# Patient Record
Sex: Female | Born: 1945 | Race: White | Hispanic: No | Marital: Married | Smoking: Never smoker
Health system: Southern US, Community
[De-identification: ages and names within clinical notes are randomized; demographics above are authoritative.]

## PROBLEM LIST (undated history)

## (undated) DIAGNOSIS — E871 Hypo-osmolality and hyponatremia: Secondary | ICD-10-CM

## (undated) DIAGNOSIS — Z9289 Personal history of other medical treatment: Secondary | ICD-10-CM

## (undated) DIAGNOSIS — K219 Gastro-esophageal reflux disease without esophagitis: Secondary | ICD-10-CM

## (undated) DIAGNOSIS — Z8679 Personal history of other diseases of the circulatory system: Secondary | ICD-10-CM

## (undated) DIAGNOSIS — M1990 Unspecified osteoarthritis, unspecified site: Secondary | ICD-10-CM

## (undated) DIAGNOSIS — F410 Panic disorder [episodic paroxysmal anxiety] without agoraphobia: Secondary | ICD-10-CM

## (undated) DIAGNOSIS — I350 Nonrheumatic aortic (valve) stenosis: Secondary | ICD-10-CM

## (undated) DIAGNOSIS — K5790 Diverticulosis of intestine, part unspecified, without perforation or abscess without bleeding: Secondary | ICD-10-CM

## (undated) DIAGNOSIS — R011 Cardiac murmur, unspecified: Secondary | ICD-10-CM

## (undated) DIAGNOSIS — I10 Essential (primary) hypertension: Secondary | ICD-10-CM

## (undated) DIAGNOSIS — Z8719 Personal history of other diseases of the digestive system: Secondary | ICD-10-CM

## (undated) HISTORY — DX: Gastro-esophageal reflux disease without esophagitis: K21.9

## (undated) HISTORY — DX: Cardiac murmur, unspecified: R01.1

## (undated) HISTORY — DX: Personal history of other diseases of the circulatory system: Z86.79

## (undated) HISTORY — DX: Hypo-osmolality and hyponatremia: E87.1

## (undated) HISTORY — PX: TUBAL LIGATION: SHX77

## (undated) HISTORY — DX: Panic disorder (episodic paroxysmal anxiety): F41.0

## (undated) HISTORY — DX: Nonrheumatic aortic (valve) stenosis: I35.0

## (undated) HISTORY — PX: BUNIONECTOMY: SHX129

---

## 1998-12-30 ENCOUNTER — Other Ambulatory Visit: Admission: RE | Admit: 1998-12-30 | Discharge: 1998-12-30 | Payer: Self-pay | Admitting: Obstetrics and Gynecology

## 1999-02-18 ENCOUNTER — Encounter (INDEPENDENT_AMBULATORY_CARE_PROVIDER_SITE_OTHER): Payer: Self-pay | Admitting: Specialist

## 1999-02-18 ENCOUNTER — Ambulatory Visit (HOSPITAL_COMMUNITY): Admission: RE | Admit: 1999-02-18 | Discharge: 1999-02-18 | Payer: Self-pay | Admitting: Gastroenterology

## 2000-03-14 ENCOUNTER — Other Ambulatory Visit: Admission: RE | Admit: 2000-03-14 | Discharge: 2000-03-14 | Payer: Self-pay | Admitting: Obstetrics and Gynecology

## 2000-03-21 ENCOUNTER — Other Ambulatory Visit: Admission: RE | Admit: 2000-03-21 | Discharge: 2000-03-21 | Payer: Self-pay | Admitting: Obstetrics and Gynecology

## 2000-03-21 ENCOUNTER — Encounter (INDEPENDENT_AMBULATORY_CARE_PROVIDER_SITE_OTHER): Payer: Self-pay

## 2003-08-24 ENCOUNTER — Ambulatory Visit (HOSPITAL_COMMUNITY): Admission: RE | Admit: 2003-08-24 | Discharge: 2003-08-24 | Payer: Self-pay | Admitting: Gastroenterology

## 2010-03-02 ENCOUNTER — Ambulatory Visit: Payer: Self-pay | Admitting: Vascular Surgery

## 2010-06-28 ENCOUNTER — Ambulatory Visit (INDEPENDENT_AMBULATORY_CARE_PROVIDER_SITE_OTHER): Payer: BC Managed Care – PPO | Admitting: Vascular Surgery

## 2010-06-28 DIAGNOSIS — I83893 Varicose veins of bilateral lower extremities with other complications: Secondary | ICD-10-CM

## 2010-07-01 NOTE — Assessment & Plan Note (Signed)
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 She had been seen initially in our office in October.  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  This is quite difficult due to pain, specifically over the varicosities and generally in her calves.  She also has had to decrease her frequency in duration of walking due to leg pain and also reports cooking and household chores are very difficult due to leg pain as well.  She has worn her compression garments with no relief, elevates her legs when possible, and does take ibuprofen for discomfort.  I feel that she has clearly failed conservative therapy.  She reports that the left leg is somewhat worse than the right.  I have recommended that she undergo staged bilateral laser ablation of her great saphenous vein and stab phlebectomy of her multiple tributaries varicosities bilaterally for symptom relief of her venous hypertension.  I explained that this is an outpatient procedure and the usual expected recovery. She understands and wishes to proceed as soon as possible.    Larina Earthly, M.D.  TFE/MEDQ  D:  06/29/2010  T:  06/29/2010  Job:  [id number]  cc:   Henri Medal, MD

## 2010-07-13 ENCOUNTER — Other Ambulatory Visit (INDEPENDENT_AMBULATORY_CARE_PROVIDER_SITE_OTHER): Payer: BC Managed Care – PPO | Admitting: Vascular Surgery

## 2010-07-13 DIAGNOSIS — I83893 Varicose veins of bilateral lower extremities with other complications: Secondary | ICD-10-CM

## 2010-07-14 NOTE — Assessment & Plan Note (Signed)
OFFICE VISIT  Rose Burgess, Rose Burgess DOB:  [birth year]                                       07/13/2010 [id number]#:[id number]  The patient presents today for left leg great saphenous vein laser ablation and stab phlebectomy of multiple tributaries varicosities.  She had extensive varicosities over anterior her thigh, calf and well into the dorsum of her foot.  She underwent laser ablation of her great saphenous vein from just below the knee to the saphenofemoral junction and stab phlebectomy of greater than 20 tributaries.  She did well and was discharged to home.  She will follow up with [place] again in 1 week for a repeat duplex.    Larina Earthly, M.D. Electronically Signed  TFE/MEDQ  D:  07/13/2010  T:  07/14/2010  Job:  [id number]

## 2010-07-19 ENCOUNTER — Ambulatory Visit (INDEPENDENT_AMBULATORY_CARE_PROVIDER_SITE_OTHER): Payer: BC Managed Care – PPO | Admitting: Vascular Surgery

## 2010-07-19 ENCOUNTER — Encounter (INDEPENDENT_AMBULATORY_CARE_PROVIDER_SITE_OTHER): Payer: BC Managed Care – PPO

## 2010-07-19 ENCOUNTER — Other Ambulatory Visit: Payer: BC Managed Care – PPO | Admitting: Vascular Surgery

## 2010-07-19 DIAGNOSIS — I83893 Varicose veins of bilateral lower extremities with other complications: Secondary | ICD-10-CM

## 2010-07-19 DIAGNOSIS — I87399 Chronic venous hypertension (idiopathic) with other complications of unspecified lower extremity: Secondary | ICD-10-CM

## 2010-07-19 DIAGNOSIS — Z48812 Encounter for surgical aftercare following surgery on the circulatory system: Secondary | ICD-10-CM

## 2010-07-20 NOTE — Assessment & Plan Note (Signed)
OFFICE VISIT  Rose, Burgess DOB:  1946-04-09                                       07/19/2010 HYQMV#:[id number]  The patient presents today for followup of her left great saphenous vein laser ablation and stab phlebectomy of multiple varicosities throughout her thigh, calf and onto the dorsum of the foot.  She did extremely well and reports marked relief in her heaviness in her left leg.  She underwent repeat duplex today in our office and this shows closure of her saphenous vein with no evidence of DVT.  I am quite pleased with the result as is the patient.  We plan to see her again for similar treatment in her right leg at her convenience in mid April.    Larina Earthly, M.D. Electronically Signed  TFE/MEDQ  D:  07/19/2010  T:  07/20/2010  Job:  [id number]  cc:   Henri Medal, MD

## 2010-07-26 NOTE — Procedures (Unsigned)
DUPLEX DEEP VENOUS EXAM - LOWER EXTREMITY  INDICATION:  Left greater saphenous vein laser ablation.  HISTORY:  Edema:  Yes. Trauma/Surgery:  Left greater saphenous vein laser ablation on 07/13/2010. Pain:  No. PE:  No. Previous DVT:  No. Anticoagulants: Other:  DUPLEX EXAM:               CFV   SFV   PopV  PTV    GSV               R  L  R  L  R  L  R   L  R  L Thrombosis    o  o     o     o      o     + Spontaneous   +  +     +     +      +     0 Phasic        +  +     +     +      +     0 Augmentation  +  +     +     +      +     0 Compressible  +  +     +     +      +     0 Competent     0  0     0     +      +     0  Legend:  + - yes  o - no  p - partial  D - decreased  IMPRESSION: 1. No evidence of deep vein thrombosis noted in the left lower     extremity. 2. The left greater saphenous vein appears totally occluded from the     distal insertion site to near the saphenofemoral junction. 3. Reflux of greater than 500 milliseconds noted in the left     saphenofemoral junction and bilateral common femoral veins.   _____________________________ Larina Earthly, M.D.  CH/MEDQ  D:  07/20/2010  T:  07/20/2010  Job:  [id number]

## 2010-08-31 ENCOUNTER — Other Ambulatory Visit (INDEPENDENT_AMBULATORY_CARE_PROVIDER_SITE_OTHER): Payer: BC Managed Care – PPO | Admitting: Vascular Surgery

## 2010-08-31 DIAGNOSIS — I83893 Varicose veins of bilateral lower extremities with other complications: Secondary | ICD-10-CM

## 2010-09-01 NOTE — Assessment & Plan Note (Signed)
OFFICE VISIT  Rose Burgess, STUCK DOB:  06-25-1945                                       08/31/2010 [id number]#:[id number]  The patient underwent right great saphenous vein laser ablation from the level just below her knee up to just below the saphenofemoral junction and stab phlebectomy of multiple tributary varicosities in her thigh and calf.  She did have need for 2 different sheaths and 2 different laser fibers.  She had an area of tortuosity at one focal area in her mid thigh and therefore was treated from her below knee to mid thigh and mid thigh to just below the saphenofemoral junction.  She had no immediate complication and was discharged home and will be seen again in 1 week for duplex followup.    Larina Earthly, M.D. Electronically Signed  TFE/MEDQ  D:  08/31/2010  T:  09/01/2010  Job:  [id number]

## 2010-09-06 ENCOUNTER — Ambulatory Visit (INDEPENDENT_AMBULATORY_CARE_PROVIDER_SITE_OTHER): Payer: BC Managed Care – PPO | Admitting: Vascular Surgery

## 2010-09-06 ENCOUNTER — Encounter (INDEPENDENT_AMBULATORY_CARE_PROVIDER_SITE_OTHER): Payer: BC Managed Care – PPO

## 2010-09-06 DIAGNOSIS — I83893 Varicose veins of bilateral lower extremities with other complications: Secondary | ICD-10-CM

## 2010-09-06 DIAGNOSIS — Z48812 Encounter for surgical aftercare following surgery on the circulatory system: Secondary | ICD-10-CM

## 2010-09-07 NOTE — Assessment & Plan Note (Signed)
OFFICE VISIT  Rose Burgess, Rose Burgess DOB:  [birth year]                                       09/06/2010 EAVWU#:[id number]  This patient returns one week post laser ablation of her right great saphenous vein with multiple stab phlebectomies performed by Dr. 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  She tolerated the procedures well last week and has had some mild to moderate discomfort which is rapidly improving. She is wearing her stockings and taking ibuprofen as instructed.  PHYSICAL EXAMINATION:  Blood pressure 166/65, heart rate 85, respirations 20.  There is moderate ecchymosis in the distal thigh on the right and mild tenderness.  There is no distal edema.  Stab phlebectomy sites are healing well.  Venous duplex exam revealed no DVT, successful closure of the right great saphenous vein from the distal insertion site to the saphenofemoral junction.  We have reassured her regarding these findings and she will return to see [place] on a p.r.n. basis.    Quita Skye Hart [place], M.D. Electronically Signed  JDL/MEDQ  D:  09/06/2010  T:  09/07/2010  Job:  [id number]

## 2010-09-13 NOTE — Procedures (Unsigned)
DUPLEX DEEP VENOUS EXAM - LOWER EXTREMITY  INDICATION:  Followup right greater saphenous vein ablation.  HISTORY:  Edema:  No. Trauma/Surgery:  Right greater saphenous vein ablation 08/31/2010. Pain:  Yes. PE:  No. Previous DVT:  No. Anticoagulants:  No. Other:  DUPLEX EXAM:               CFV   SFV   PopV  PTV    GSV               R  L  R  L  R  L  R   L  R  L Thrombosis    o  o  o     o     o      o  + Spontaneous   +  +  +     +     +      +  o Phasic        +  +  +     +     +      +  o Augmentation  +  +  +     +     +      +  o Compressible  +  +  +     +     +      +  o Competent     +  +  +     +     +      +  +  Legend:  + - yes  o - no  p - partial  D - decreased  IMPRESSION: 1. No evidence of acute deep venous thrombosis. 2. Successful right greater saphenous vein ablation from the     saphenofemoral junction to distal insertion site.   _____________________________ Larina Earthly, M.D.  OD/MEDQ  D:  09/06/2010  T:  09/06/2010  Job:  [id number]

## 2010-10-04 NOTE — Procedures (Signed)
LOWER EXTREMITY VENOUS REFLUX EXAM   INDICATION:  Painful, raised varicose veins.   EXAM:  Using color-flow imaging and pulse Doppler spectral analysis, the  right and left common femoral, superficial femoral, popliteal, posterior  tibial, greater and lesser saphenous veins are evaluated.  There is  evidence suggesting deep venous insufficiency in the right and left  lower extremities.   The right and left saphenofemoral junctions are not competent with  reflux of >524milliseconds. The right and left GSV's are not competent  with reflux of >569milliseconds with the caliber as described below.   The right and left proximal short saphenous veins demonstrate  competency.   GSV Diameter (used if found to be incompetent only)                                            Right    Left  Proximal Greater Saphenous Vein           0.80 cm  0.94 cm  Proximal-to-mid-thigh                     0.58 cm  0.45 cm  Mid thigh                                 0.93 cm  0.31 cm  Mid-distal thigh                          cm       cm  Distal thigh                              0.52 cm  0.31 cm  Knee                                      0.29 cm  0.30 cm   IMPRESSION:  1. The right and left greater saphenous veins are not competent with      reflux >546milliseconds.  2. The right and left greater saphenous veins are not tortuous above      the knee but is tortuous below the knee on the right leg.  3. The deep venous system is not competent with reflux of      >572milliseconds.  4. The right and left short saphenous vein is competent.           ___________________________________________  Larina Earthly, M.D.   EM/MEDQ  D:  03/02/2010  T:  03/02/2010  Job:  [id number]

## 2010-10-04 NOTE — Consult Note (Signed)
NEW PATIENT CONSULTATION   Rose Burgess, Rose Burgess  DOB:  August 17, 1945                                       03/02/2010  EAVWU#:[id number]   The patient presents today for evaluation of bilateral extremity  varicosities.  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  These have been present for many years and have been more  persistent over the past several years.  She does not have any history  of DVT or superficial thrombophlebitis or bleeding.  She does have a  tired, heavy sensation over both lower extremities and has pain  specifically over varicosities.  She does elevate her legs when possible  and has worn support hose but never worn graduated compression garments.   PAST MEDICAL HISTORY:  Significant for hypertension.  She does have a  prior surgical history of tubal ligation and bunion surgery on her foot.   SOCIAL HISTORY:  She is married with 3 children.  She works as a  Midwife.  She does not smoke or drink alcohol.   FAMILY HISTORY:  Negative for premature atherosclerotic disease.   REVIEW OF SYSTEMS:  No weight loss or weight gain.  She reports her  weight is 165 pounds.  She is 5 feet 2 inches tall.  She does have a  history of gastroesophageal reflux, otherwise her review of systems is  negative aside from the HPI.   PHYSICAL EXAMINATION:  A well-developed, well-nourished white female  appearing stated age of 62.  Blood pressure is 154/75, pulse 66,  respirations 18.  HEENT is normal.  Her radial and dorsalis pedis pulses  are 2+ bilaterally.  Abdomen is soft and nontender.  Musculoskeletal  shows no major deformity or cyanosis.  Neurologic:  No focal weakness or  paresthesias.  Skin:  Without ulcers or rashes.  She does have varices  over both lower extremities in the calf and thigh.   She underwent formal duplex, and this reveals reflux  throughout her  great saphenous vein bilaterally extending into the varices.  She does  not have any reflux in her small saphenous vein and mild reflux in the  deep system.  I have discussed the significance of this with the  patient.  We have fitted her today with thigh-high graduated compression  garments and instructed her on the daily use of these.  We will see her  again in 3 months for continued a discussion.  I did discuss the option  of ablation of her saphenous vein for improvement of her venous  hypertension should she fail conservative treatment.     Larina Earthly, M.D.  Electronically Signed   TFE/MEDQ  D:  03/02/2010  T:  03/03/2010  Job:  [id number]   cc:   Dr. Jackalyn Lombard

## 2010-10-07 NOTE — Op Note (Signed)
NAME:  Rose Burgess, Rose Burgess                         ACCOUNT NO.:  [id number]   MEDICAL RECORD NO.:  [id number]                   PATIENT TYPE:  AMB   LOCATION:  ENDO                                 FACILITY:  [facility]   PHYSICIAN:  James L. Malon Kindle., M.D.          DATE OF BIRTH:  03-20-1946   DATE OF PROCEDURE:  08/24/2003  DATE OF DISCHARGE:                                 OPERATIVE REPORT   PROCEDURE:  Colonoscopy.   MEDICATIONS:  Fentanyl 100 mg, Versed 10 mg IV.   SCOPE:  Olympus pediatric colonoscope.   INDICATIONS:  Previous history of adenomatous polyps with strong family  history of colon cancer.  Mother had colon cancer.   DESCRIPTION OF PROCEDURE:  The procedure had been explained to the patient  and consent obtained.  With the patient in the left lateral decubitus  position, the Olympus scope was inserted and advanced.  The prep was  excellent.  We were able to reach the cecum without difficulty.  The  ileocecal valve was seen.  The scope was withdrawn.  The cecum, ascending  colon, hepatic flexure, transverse colon, splenic flexure, descending and  sigmoid colon were seen well and no polyps or other lesions were seen.  The  scope was withdrawn.  The rectum was free of polyps.  There was no  significant diverticular disease.  The patient tolerated the procedure well.  Maintained on low flow oxygen and pulse oximetry throughout the procedure.   ASSESSMENT:  1. History of previous adenomatous polyps, V12.72.  2. Strong family history of colon cancer, V16.0.   PLAN:  Will recommend yearly Hemoccult's and repeat procedure in five years.                                               James L. Malon Kindle., M.D.    Waldron Session  D:  08/24/2003  T:  08/24/2003  Job:  [id number]   cc:   Teena Irani. Arlyce Dice, M.D.  [address]  [place]  [place] [address]  Fax: [phone]

## 2011-05-23 HISTORY — PX: OTHER SURGICAL HISTORY: SHX169

## 2013-09-19 NOTE — Patient Instructions (Signed)
Rose Burgess  09/19/2013   Your procedure is scheduled on:  09/30/13  [id number]  Report to [facility] at     0515 AM.  Call this number if you have problems the morning of surgery: [phone]   Remember:   Do not eat food or drink liquids after midnight.   Take these medicines the morning of surgery with A SIP OF WATER:    Do not wear jewelry, make-up or nail polish.  Do not wear lotions, powders, or perfumes.   Do not shave 48 hours prior to surgery.   Do not bring valuables to the hospital.  Contacts, dentures or bridgework may not be worn into surgery.  Leave suitcase in the car. After surgery it may be brought to your room.  For patients admitted to the hospital, checkout time is 11:00 AM the day of  discharge.        Please read over the following fact sheets that you were given: MRSA Information,[facility] [place] - Preparing for Surgery Before surgery, you can play an important role.  Because skin is not sterile, your skin needs to be as free of germs as possible.  You can reduce the number of germs on your skin by washing with CHG (chlorahexidine gluconate) soap before surgery.  CHG is an antiseptic cleaner which kills germs and bonds with the skin to continue killing germs even after washing. Please DO NOT use if you have an allergy to CHG or antibacterial soaps.  If your skin becomes reddened/irritated stop using the CHG and inform your nurse when you arrive at Short Stay. Do not shave (including legs and underarms) for at least 48 hours prior to the first CHG shower.  You may shave your face. Please follow these instructions carefully:  1.  Shower with CHG Soap the night before surgery and the  morning of Surgery.  2.  If you choose to wash your hair, wash your hair first as usual with your  normal  shampoo.  3.  After you shampoo, rinse your hair and body thoroughly to remove the  shampoo.                           4.  Use CHG as you would any other liquid  soap.  You can apply chg directly  to the skin and wash                       Gently with a scrungie or clean washcloth.  5.  Apply the CHG Soap to your body ONLY FROM THE NECK DOWN.   Do not use on open                           Wound or open sores. Avoid contact with eyes, ears mouth and genitals (private parts).                        Genitals (private parts) with your normal soap.             6.  Wash thoroughly, paying special attention to the area where your surgery  will be performed.  7.  Thoroughly rinse your body with warm water from the neck down.  8.  DO NOT shower/wash with your normal soap after using and rinsing off  the CHG Soap.  9.  Pat yourself dry with a clean towel.            10.  Wear clean pajamas.            11.  Place clean sheets on your bed the night of your first shower and do not  sleep with pets. Day of Surgery : Do not apply any lotions/deodorants the morning of surgery.  Please wear clean clothes to the hospital/surgery center.  FAILURE TO FOLLOW THESE INSTRUCTIONS MAY RESULT IN THE CANCELLATION OF YOUR SURGERY PATIENT SIGNATURE_________________________________  NURSE SIGNATURE__________________________________  ________________________________________________________________________   Adam Phenix  An incentive spirometer is a tool that can help keep your lungs clear and active. This tool measures how well you are filling your lungs with each breath. Taking long deep breaths may help reverse or decrease the chance of developing breathing (pulmonary) problems (especially infection) following:  A long period of time when you are unable to move or be active. BEFORE THE PROCEDURE   If the spirometer includes an indicator to show your best effort, your nurse or respiratory therapist will set it to a desired goal.  If possible, sit up straight or lean slightly forward. Try not to slouch.  Hold the incentive spirometer in an upright  position. INSTRUCTIONS FOR USE  1. Sit on the edge of your bed if possible, or sit up as far as you can in bed or on a chair. 2. Hold the incentive spirometer in an upright position. 3. Breathe out normally. 4. Place the mouthpiece in your mouth and seal your lips tightly around it. 5. Breathe in slowly and as deeply as possible, raising the piston or the ball toward the top of the column. 6. Hold your breath for 3-5 seconds or for as long as possible. Allow the piston or ball to fall to the bottom of the column. 7. Remove the mouthpiece from your mouth and breathe out normally. 8. Rest for a few seconds and repeat Steps 1 through 7 at least 10 times every 1-2 hours when you are awake. Take your time and take a few normal breaths between deep breaths. 9. The spirometer may include an indicator to show your best effort. Use the indicator as a goal to work toward during each repetition. 10. After each set of 10 deep breaths, practice coughing to be sure your lungs are clear. If you have an incision (the cut made at the time of surgery), support your incision when coughing by placing a pillow or rolled up towels firmly against it. Once you are able to get out of bed, walk around indoors and cough well. You may stop using the incentive spirometer when instructed by your caregiver.  RISKS AND COMPLICATIONS  Take your time so you do not get dizzy or light-headed.  If you are in pain, you may need to take or ask for pain medication before doing incentive spirometry. It is harder to take a deep breath if you are having pain. AFTER USE  Rest and breathe slowly and easily.  It can be helpful to keep track of a log of your progress. Your caregiver can provide you with a simple table to help with this. If you are using the spirometer at home, follow these instructions: [place] IF:   You are having difficultly using the spirometer.  You have trouble using the spirometer as often as  instructed.  Your pain medication is not giving enough relief while using the spirometer.  You  develop fever of 100.5 F (38.1 C) or higher. SEEK IMMEDIATE MEDICAL CARE IF:   You cough up bloody sputum that had not been present before.  You develop fever of 102 F (38.9 C) or greater.  You develop worsening pain at or near the incision site. MAKE SURE YOU:   Understand these instructions.  Will watch your condition.  Will get help right away if you are not doing well or get worse. Document Released: 09/18/2006 Document Revised: 07/31/2011 Document Reviewed: 11/19/2006 ExitCare Patient Information 2014 ExitCare, [place].   ________________________________________________________________________  WHAT IS A BLOOD TRANSFUSION? Blood Transfusion Information  A transfusion is the replacement of blood or some of its parts. Blood is made up of multiple cells which provide different functions.  Red blood cells carry oxygen and are used for blood loss replacement.  White blood cells fight against infection.  Platelets control bleeding.  Plasma helps clot blood.  Other blood products are available for specialized needs, such as hemophilia or other clotting disorders. BEFORE THE TRANSFUSION  Who gives blood for transfusions?   Healthy volunteers who are fully evaluated to make sure their blood is safe. This is blood bank blood. Transfusion therapy is the safest it has ever been in the practice of medicine. Before blood is taken from a donor, a complete history is taken to make sure that person has no history of diseases nor engages in risky social behavior (examples are intravenous drug use or sexual activity with multiple partners). The donor's travel history is screened to minimize risk of transmitting infections, such as malaria. The donated blood is tested for signs of infectious diseases, such as HIV and hepatitis. The blood is then tested to be sure it is compatible with you in  order to minimize the chance of a transfusion reaction. If you or a relative donates blood, this is often done in anticipation of surgery and is not appropriate for emergency situations. It takes many days to process the donated blood. RISKS AND COMPLICATIONS Although transfusion therapy is very safe and saves many lives, the main dangers of transfusion include:   Getting an infectious disease.  Developing a transfusion reaction. This is an allergic reaction to something in the blood you were given. Every precaution is taken to prevent this. The decision to have a blood transfusion has been considered carefully by your caregiver before blood is given. Blood is not given unless the benefits outweigh the risks. AFTER THE TRANSFUSION  Right after receiving a blood transfusion, you will usually feel much better and more energetic. This is especially true if your red blood cells have gotten low (anemic). The transfusion raises the level of the red blood cells which carry oxygen, and this usually causes an energy increase.  The nurse administering the transfusion will monitor you carefully for complications. HOME CARE INSTRUCTIONS  No special instructions are needed after a transfusion. You may find your energy is better. Speak with your caregiver about any limitations on activity for underlying diseases you may have. SEEK MEDICAL CARE IF:   Your condition is not improving after your transfusion.  You develop redness or irritation at the intravenous (IV) site. SEEK IMMEDIATE MEDICAL CARE IF:  Any of the following symptoms occur over the next 12 hours:  Shaking chills.  You have a temperature by mouth above 102 F (38.9 C), not controlled by medicine.  Chest, back, or muscle pain.  People around you feel you are not acting correctly or are confused.  Shortness of breath  or difficulty breathing.  Dizziness and fainting.  You get a rash or develop hives.  You have a decrease in urine  output.  Your urine turns a dark color or changes to pink, red, or brown. Any of the following symptoms occur over the next 10 days:  You have a temperature by mouth above 102 F (38.9 C), not controlled by medicine.  Shortness of breath.  Weakness after normal activity.  The white part of the eye turns yellow (jaundice).  You have a decrease in the amount of urine or are urinating less often.  Your urine turns a dark color or changes to pink, red, or brown. Document Released: 05/05/2000 Document Revised: 07/31/2011 Document Reviewed: 12/23/2007 [facility] Patient Information 2014 [place], [place].  _______________________________________________________________________

## 2013-09-22 ENCOUNTER — Encounter (HOSPITAL_COMMUNITY): Payer: Self-pay

## 2013-09-22 ENCOUNTER — Ambulatory Visit (HOSPITAL_COMMUNITY)
Admission: RE | Admit: 2013-09-22 | Discharge: 2013-09-22 | Disposition: A | Discharge status: Home / Self Care | Payer: Medicare Other | Source: Ambulatory Visit | Attending: Orthopedic Surgery | Admitting: Orthopedic Surgery

## 2013-09-22 ENCOUNTER — Encounter (INDEPENDENT_AMBULATORY_CARE_PROVIDER_SITE_OTHER): Payer: Self-pay

## 2013-09-22 ENCOUNTER — Encounter (HOSPITAL_COMMUNITY)
Admission: RE | Admit: 2013-09-22 | Discharge: 2013-09-22 | Disposition: A | Discharge status: Home / Self Care | Payer: Medicare Other | Source: Ambulatory Visit | Attending: Orthopedic Surgery | Admitting: Orthopedic Surgery

## 2013-09-22 ENCOUNTER — Encounter (HOSPITAL_COMMUNITY): Payer: Self-pay | Admitting: Pharmacy Technician

## 2013-09-22 DIAGNOSIS — I517 Cardiomegaly: Secondary | ICD-10-CM | POA: Insufficient documentation

## 2013-09-22 DIAGNOSIS — Z01812 Encounter for preprocedural laboratory examination: Secondary | ICD-10-CM | POA: Insufficient documentation

## 2013-09-22 DIAGNOSIS — Z01810 Encounter for preprocedural cardiovascular examination: Secondary | ICD-10-CM | POA: Insufficient documentation

## 2013-09-22 DIAGNOSIS — Z01818 Encounter for other preprocedural examination: Secondary | ICD-10-CM | POA: Insufficient documentation

## 2013-09-22 HISTORY — DX: Diverticulosis of intestine, part unspecified, without perforation or abscess without bleeding: K57.90

## 2013-09-22 HISTORY — DX: Essential (primary) hypertension: I10

## 2013-09-22 HISTORY — DX: Personal history of other diseases of the digestive system: Z87.19

## 2013-09-22 HISTORY — DX: Gastro-esophageal reflux disease without esophagitis: K21.9

## 2013-09-22 HISTORY — DX: Unspecified osteoarthritis, unspecified site: M19.90

## 2013-09-22 HISTORY — DX: Personal history of other medical treatment: Z92.89

## 2013-09-22 LAB — PROTIME-INR
INR: 0.93 (ref 0.00–1.49)
Prothrombin Time: 12.3 seconds (ref 11.6–15.2)

## 2013-09-22 LAB — BASIC METABOLIC PANEL
BUN: 27 mg/dL — ABNORMAL HIGH (ref 6–23)
CO2: 26 mEq/L (ref 19–32)
CREATININE: 0.79 mg/dL (ref 0.50–1.10)
Calcium: 9.6 mg/dL (ref 8.4–10.5)
Chloride: 94 mEq/L — ABNORMAL LOW (ref 96–112)
GFR calc non Af Amer: 83 mL/min — ABNORMAL LOW (ref >90)
Glucose, Bld: 95 mg/dL (ref 70–99)
Potassium: 4.9 mEq/L (ref 3.7–5.3)
Sodium: 132 mEq/L — ABNORMAL LOW (ref 137–147)

## 2013-09-22 LAB — CBC
HEMATOCRIT: 36.6 % (ref 36.0–46.0)
Hemoglobin: 12.2 g/dL (ref 12.0–15.0)
MCH: 30 pg (ref 26.0–34.0)
MCHC: 33.3 g/dL (ref 30.0–36.0)
MCV: 89.9 fL (ref 78.0–100.0)
PLATELETS: 298 10*3/uL (ref 150–400)
RBC: 4.07 MIL/uL (ref 3.87–5.11)
RDW: 13 % (ref 11.5–15.5)
WBC: 7.8 10*3/uL (ref 4.0–10.5)

## 2013-09-22 LAB — URINALYSIS, ROUTINE W REFLEX MICROSCOPIC
Bilirubin Urine: NEGATIVE
Glucose, UA: NEGATIVE mg/dL
HGB URINE DIPSTICK: NEGATIVE
KETONES UR: NEGATIVE mg/dL
Leukocytes, UA: NEGATIVE
Nitrite: NEGATIVE
PROTEIN: NEGATIVE mg/dL
Specific Gravity, Urine: 1.017 (ref 1.005–1.030)
UROBILINOGEN UA: 0.2 mg/dL (ref 0.0–1.0)
pH: 5 (ref 5.0–8.0)

## 2013-09-22 LAB — SURGICAL PCR SCREEN
MRSA, PCR: NEGATIVE
Staphylococcus aureus: POSITIVE — AB

## 2013-09-22 LAB — APTT: APTT: 31 s (ref 24–37)

## 2013-09-22 LAB — ABO/RH: ABO/RH(D): O POS

## 2013-09-22 NOTE — Progress Notes (Signed)
Left message for patient on [phone] for positivie pcr for staph.  Asked patient to call back to [phone] to make sure patient received message.

## 2013-09-22 NOTE — Progress Notes (Signed)
Blood pressure at beginning of preop appointment was 177/75.  Blood pressure rechecked at end of preop appointment- 160/72.  Patient voiced no complaints.

## 2013-09-22 NOTE — Progress Notes (Signed)
Clearance note on chart from  Dr Andrey CampanileWilson dated 06/23/2013.

## 2013-09-22 NOTE — Progress Notes (Signed)
Patient called back and has received message regarding positive for staph for pcr and aware to pick up script at [facility] for Mupirocin.

## 2013-09-28 NOTE — H&P (Signed)
TOTAL HIP ADMISSION H&P  Patient is admitted for right total hip arthroplasty, anterior approach.  Subjective:  Chief Complaint: Right hip OA / pain  HPI: Rose Burgess, 68 y.o. female, has a history of pain and functional disability in the right hip(s) due to arthritis and patient has failed non-surgical conservative treatments for greater than 12 weeks to include NSAID's and/or analgesics, corticosteriod injections and activity modification.  Onset of symptoms was gradual starting 1 years ago with gradually worsening course since that time.The patient noted no past surgery on the right hip(s).  Patient currently rates pain in the right hip at 10 out of 10 with activity. Patient has worsening of pain with activity and weight bearing, trendelenberg gait, pain that interfers with activities of daily living and pain with passive range of motion. Patient has evidence of periarticular osteophytes and joint space narrowing by imaging studies. This condition presents safety issues increasing the risk of falls. There is no current active infection.  Risks, benefits and expectations were discussed with the patient.  Risks including but not limited to the risk of anesthesia, blood clots, nerve damage, blood vessel damage, failure of the prosthesis, infection and up to and including death.  Patient understand the risks, benefits and expectations and wishes to proceed with surgery.   D/C Plans:   Home with HHPT  Post-op Meds:     No Rx given  Tranexamic Acid:   To be given  Decadron:    To be given  FYI:    ASA post-op  Norco post-op    Past Medical History  Diagnosis Date  . Hypertension   . Diverticulosis   . GERD (gastroesophageal reflux disease)   . H/O hiatal hernia   . Arthritis   . History of blood transfusion     07/2012 - several blood transfusions per patient     Past Surgical History  Procedure Laterality Date  . Colon surgery for devierticulosis     . Tubal ligation      No  prescriptions prior to admission   No Known Allergies   History  Substance Use Topics  . Smoking status: Never Smoker   . Smokeless tobacco: Never Used  . Alcohol Use: No    No family history on file.   Review of Systems  Constitutional: Negative.   HENT: Negative.   Eyes: Negative.   Respiratory: Negative.   Cardiovascular: Negative.   Gastrointestinal: Positive for heartburn.  Genitourinary: Negative.   Musculoskeletal: Positive for joint pain.  Skin: Negative.   Neurological: Negative.   Endo/Heme/Allergies: Negative.   Psychiatric/Behavioral: Negative.     Objective:  Physical Exam  Constitutional: She is oriented to person, place, and time. She appears well-developed and well-nourished.  HENT:  Head: Normocephalic and atraumatic.  Mouth/Throat: Oropharynx is clear and moist.  Eyes: Pupils are equal, round, and reactive to light.  Neck: Neck supple. No JVD present. No tracheal deviation present. No thyromegaly present.  Cardiovascular: Normal rate, regular rhythm, normal heart sounds and intact distal pulses.   Respiratory: Effort normal and breath sounds normal. No stridor. No respiratory distress. She has no wheezes.  GI: Soft. There is no tenderness. There is no guarding.  Musculoskeletal:       Right hip: She exhibits decreased range of motion, decreased strength, tenderness and bony tenderness. She exhibits no swelling, no deformity and no laceration.  Lymphadenopathy:    She has no cervical adenopathy.  Neurological: She is alert and oriented to person, place, and  time.  Skin: Skin is warm and dry.  Psychiatric: She has a normal mood and affect.      Imaging Review Plain radiographs demonstrate severe degenerative joint disease of the right hip(s). The bone quality appears to be good for age and reported activity level.  Assessment/Plan:  End stage arthritis, right hip(s)  The patient history, physical examination, clinical judgement of the provider  and imaging studies are consistent with end stage degenerative joint disease of the right hip(s) and total hip arthroplasty is deemed medically necessary. The treatment options including medical management, injection therapy, arthroscopy and arthroplasty were discussed at length. The risks and benefits of total hip arthroplasty were presented and reviewed. The risks due to aseptic loosening, infection, stiffness, dislocation/subluxation,  thromboembolic complications and other imponderables were discussed.  The patient acknowledged the explanation, agreed to proceed with the plan and consent was signed. Patient is being admitted for inpatient treatment for surgery, pain control, PT, OT, prophylactic antibiotics, VTE prophylaxis, progressive ambulation and ADL's and discharge planning.The patient is planning to be discharged home with home health services.       Anastasio AuerbachMatthew S. Nataliah Hatlestad   PAC  09/28/2013, 9:08 PM

## 2013-09-30 ENCOUNTER — Inpatient Hospital Stay (HOSPITAL_COMMUNITY)
Admission: RE | Admit: 2013-09-30 | Discharge: 2013-10-01 | DRG: 470 | Disposition: A | Discharge status: Home Health | Payer: Medicare Other | Source: Ambulatory Visit | Attending: Orthopedic Surgery | Admitting: Orthopedic Surgery

## 2013-09-30 ENCOUNTER — Inpatient Hospital Stay (HOSPITAL_COMMUNITY): Payer: Medicare Other | Admitting: Anesthesiology

## 2013-09-30 ENCOUNTER — Inpatient Hospital Stay (HOSPITAL_COMMUNITY): Payer: Medicare Other

## 2013-09-30 ENCOUNTER — Encounter (HOSPITAL_COMMUNITY): Payer: Self-pay | Admitting: *Deleted

## 2013-09-30 ENCOUNTER — Encounter (HOSPITAL_COMMUNITY)
Admission: RE | Disposition: A | Discharge status: Home Health | Payer: Self-pay | Source: Ambulatory Visit | Attending: Orthopedic Surgery

## 2013-09-30 ENCOUNTER — Encounter (HOSPITAL_COMMUNITY): Payer: Medicare Other | Admitting: Anesthesiology

## 2013-09-30 DIAGNOSIS — E669 Obesity, unspecified: Secondary | ICD-10-CM | POA: Diagnosis present

## 2013-09-30 DIAGNOSIS — K219 Gastro-esophageal reflux disease without esophagitis: Secondary | ICD-10-CM | POA: Diagnosis present

## 2013-09-30 DIAGNOSIS — I10 Essential (primary) hypertension: Secondary | ICD-10-CM | POA: Diagnosis present

## 2013-09-30 DIAGNOSIS — Z8719 Personal history of other diseases of the digestive system: Secondary | ICD-10-CM

## 2013-09-30 DIAGNOSIS — M1610 Unilateral primary osteoarthritis, unspecified hip: Principal | ICD-10-CM | POA: Diagnosis present

## 2013-09-30 DIAGNOSIS — Z96649 Presence of unspecified artificial hip joint: Secondary | ICD-10-CM

## 2013-09-30 DIAGNOSIS — Z6832 Body mass index (BMI) 32.0-32.9, adult: Secondary | ICD-10-CM

## 2013-09-30 DIAGNOSIS — M169 Osteoarthritis of hip, unspecified: Principal | ICD-10-CM | POA: Diagnosis present

## 2013-09-30 DIAGNOSIS — D62 Acute posthemorrhagic anemia: Secondary | ICD-10-CM | POA: Diagnosis not present

## 2013-09-30 DIAGNOSIS — D500 Iron deficiency anemia secondary to blood loss (chronic): Secondary | ICD-10-CM | POA: Diagnosis not present

## 2013-09-30 HISTORY — PX: TOTAL HIP ARTHROPLASTY: SHX124

## 2013-09-30 LAB — TYPE AND SCREEN
ABO/RH(D): O POS
ANTIBODY SCREEN: NEGATIVE

## 2013-09-30 SURGERY — ARTHROPLASTY, HIP, TOTAL, ANTERIOR APPROACH
Anesthesia: Spinal | Site: Hip | Laterality: Right

## 2013-09-30 MED ORDER — ACETAMINOPHEN 10 MG/ML IV SOLN
1000.0000 mg | Freq: Once | INTRAVENOUS | Status: AC
  Administered 2013-09-30: 1000 mg via INTRAVENOUS
  Filled 2013-09-30 (×2): qty 100

## 2013-09-30 MED ORDER — PROPOFOL INFUSION 10 MG/ML OPTIME
INTRAVENOUS | Status: DC | PRN
  Administered 2013-09-30: 100 ug/kg/min via INTRAVENOUS

## 2013-09-30 MED ORDER — SODIUM CHLORIDE 0.9 % IV SOLN
INTRAVENOUS | Status: DC
  Administered 2013-09-30: 12:00:00 via INTRAVENOUS
  Filled 2013-09-30 (×4): qty 1000

## 2013-09-30 MED ORDER — PROPOFOL 10 MG/ML IV BOLUS
INTRAVENOUS | Status: AC
  Filled 2013-09-30: qty 20

## 2013-09-30 MED ORDER — MIDAZOLAM HCL 2 MG/2ML IJ SOLN
INTRAMUSCULAR | Status: AC
  Filled 2013-09-30: qty 2

## 2013-09-30 MED ORDER — LIDOCAINE HCL (CARDIAC) 20 MG/ML IV SOLN
INTRAVENOUS | Status: AC
  Filled 2013-09-30: qty 5

## 2013-09-30 MED ORDER — PHENOL 1.4 % MT LIQD: 1.0000 | OROMUCOSAL | Status: DC | PRN

## 2013-09-30 MED ORDER — DIPHENHYDRAMINE HCL 12.5 MG/5ML PO ELIX: 25.0000 mg | ORAL_SOLUTION | Freq: Four times a day (QID) | ORAL | Status: DC | PRN

## 2013-09-30 MED ORDER — HYDROCODONE-ACETAMINOPHEN 7.5-325 MG PO TABS
1.0000 | ORAL_TABLET | ORAL | Status: DC | PRN
Start: 2013-09-30 — End: 2013-10-01
  Administered 2013-09-30 – 2013-10-01 (×3): 2 via ORAL
  Filled 2013-09-30 (×3): qty 2

## 2013-09-30 MED ORDER — ASPIRIN EC 325 MG PO TBEC
325.0000 mg | DELAYED_RELEASE_TABLET | Freq: Two times a day (BID) | ORAL | Status: DC
  Administered 2013-09-30 – 2013-10-01 (×2): 325 mg via ORAL
  Filled 2013-09-30 (×4): qty 1

## 2013-09-30 MED ORDER — HYDROMORPHONE HCL PF 1 MG/ML IJ SOLN
0.5000 mg | INTRAMUSCULAR | Status: DC | PRN
  Administered 2013-09-30: 1 mg via INTRAVENOUS
  Filled 2013-09-30: qty 1

## 2013-09-30 MED ORDER — HYDROMORPHONE HCL PF 1 MG/ML IJ SOLN: 0.5000 mg | INTRAMUSCULAR | Status: DC | PRN

## 2013-09-30 MED ORDER — LACTATED RINGERS IV SOLN
INTRAVENOUS | Status: DC | PRN
  Administered 2013-09-30 (×2): via INTRAVENOUS

## 2013-09-30 MED ORDER — TRANEXAMIC ACID 100 MG/ML IV SOLN
1000.0000 mg | Freq: Once | INTRAVENOUS | Status: AC
  Administered 2013-09-30: 1000 mg via INTRAVENOUS
  Filled 2013-09-30: qty 10

## 2013-09-30 MED ORDER — METHOCARBAMOL 500 MG PO TABS
500.0000 mg | ORAL_TABLET | Freq: Four times a day (QID) | ORAL | Status: DC | PRN
  Administered 2013-09-30 – 2013-10-01 (×2): 500 mg via ORAL
  Filled 2013-09-30 (×2): qty 1

## 2013-09-30 MED ORDER — LACTATED RINGERS IV SOLN: INTRAVENOUS | Status: DC

## 2013-09-30 MED ORDER — ALUM & MAG HYDROXIDE-SIMETH 200-200-20 MG/5ML PO SUSP: 30.0000 mL | ORAL | Status: DC | PRN

## 2013-09-30 MED ORDER — MENTHOL 3 MG MT LOZG: 1.0000 | LOZENGE | OROMUCOSAL | Status: DC | PRN

## 2013-09-30 MED ORDER — CEFAZOLIN SODIUM 1-5 GM-% IV SOLN
1.0000 g | Freq: Four times a day (QID) | INTRAVENOUS | Status: AC
  Administered 2013-09-30 (×2): 1 g via INTRAVENOUS
  Filled 2013-09-30 (×2): qty 50

## 2013-09-30 MED ORDER — CHLORHEXIDINE GLUCONATE 4 % EX LIQD: 60.0000 mL | Freq: Once | CUTANEOUS | Status: DC

## 2013-09-30 MED ORDER — DEXAMETHASONE SODIUM PHOSPHATE 10 MG/ML IJ SOLN
INTRAMUSCULAR | Status: AC
  Filled 2013-09-30: qty 1

## 2013-09-30 MED ORDER — ONDANSETRON HCL 4 MG/2ML IJ SOLN: 4.0000 mg | Freq: Four times a day (QID) | INTRAMUSCULAR | Status: DC | PRN

## 2013-09-30 MED ORDER — PHENYLEPHRINE HCL 10 MG/ML IJ SOLN
INTRAMUSCULAR | Status: DC | PRN
  Administered 2013-09-30 (×2): 40 ug via INTRAVENOUS

## 2013-09-30 MED ORDER — KETAMINE HCL 10 MG/ML IJ SOLN
INTRAMUSCULAR | Status: AC
  Filled 2013-09-30: qty 1

## 2013-09-30 MED ORDER — BUPIVACAINE IN DEXTROSE 0.75-8.25 % IT SOLN
INTRATHECAL | Status: DC | PRN
  Administered 2013-09-30: 1.4 mL via INTRATHECAL

## 2013-09-30 MED ORDER — DOCUSATE SODIUM 100 MG PO CAPS
100.0000 mg | ORAL_CAPSULE | Freq: Two times a day (BID) | ORAL | Status: DC
  Administered 2013-09-30 – 2013-10-01 (×3): 100 mg via ORAL

## 2013-09-30 MED ORDER — DEXAMETHASONE SODIUM PHOSPHATE 10 MG/ML IJ SOLN
10.0000 mg | Freq: Once | INTRAMUSCULAR | Status: AC
  Administered 2013-10-01: 10 mg via INTRAVENOUS
  Filled 2013-09-30: qty 1

## 2013-09-30 MED ORDER — FENTANYL CITRATE 0.05 MG/ML IJ SOLN
INTRAMUSCULAR | Status: AC
  Filled 2013-09-30: qty 2

## 2013-09-30 MED ORDER — CYANOCOBALAMIN 500 MCG PO TABS
500.0000 ug | ORAL_TABLET | Freq: Every day | ORAL | Status: DC
  Administered 2013-10-01: 500 ug via ORAL
  Filled 2013-09-30 (×2): qty 1

## 2013-09-30 MED ORDER — CEFAZOLIN SODIUM-DEXTROSE 2-3 GM-% IV SOLR
INTRAVENOUS | Status: AC
  Filled 2013-09-30: qty 50

## 2013-09-30 MED ORDER — ONDANSETRON HCL 4 MG PO TABS: 4.0000 mg | ORAL_TABLET | Freq: Four times a day (QID) | ORAL | Status: DC | PRN

## 2013-09-30 MED ORDER — FENTANYL CITRATE 0.05 MG/ML IJ SOLN
25.0000 ug | INTRAMUSCULAR | Status: DC | PRN
  Administered 2013-09-30 (×2): 50 ug via INTRAVENOUS

## 2013-09-30 MED ORDER — FLUTICASONE PROPIONATE (INHAL) 50 MCG/BLIST IN AEPB: 1.0000 | INHALATION_SPRAY | Freq: Two times a day (BID) | RESPIRATORY_TRACT | Status: DC | PRN

## 2013-09-30 MED ORDER — KETAMINE HCL 10 MG/ML IJ SOLN
INTRAMUSCULAR | Status: DC | PRN
  Administered 2013-09-30 (×5): 5 mg via INTRAVENOUS

## 2013-09-30 MED ORDER — ONDANSETRON HCL 4 MG/2ML IJ SOLN
INTRAMUSCULAR | Status: DC | PRN
  Administered 2013-09-30: 4 mg via INTRAVENOUS

## 2013-09-30 MED ORDER — PHENYLEPHRINE HCL 10 MG/ML IJ SOLN
INTRAMUSCULAR | Status: AC
  Filled 2013-09-30: qty 1

## 2013-09-30 MED ORDER — FERROUS SULFATE 325 (65 FE) MG PO TABS
325.0000 mg | ORAL_TABLET | Freq: Three times a day (TID) | ORAL | Status: DC
  Administered 2013-10-01: 325 mg via ORAL
  Filled 2013-09-30 (×6): qty 1

## 2013-09-30 MED ORDER — PHENYLEPHRINE HCL 10 MG/ML IJ SOLN
10.0000 mg | INTRAVENOUS | Status: DC | PRN
  Administered 2013-09-30: 10 ug/min via INTRAVENOUS

## 2013-09-30 MED ORDER — POLYETHYLENE GLYCOL 3350 17 G PO PACK: 17.0000 g | PACK | Freq: Every day | ORAL | Status: DC | PRN

## 2013-09-30 MED ORDER — HYDROCODONE-ACETAMINOPHEN 7.5-325 MG PO TABS: 1.0000 | ORAL_TABLET | Freq: Four times a day (QID) | ORAL | Status: DC | PRN

## 2013-09-30 MED ORDER — PANTOPRAZOLE SODIUM 40 MG PO TBEC
40.0000 mg | DELAYED_RELEASE_TABLET | Freq: Every day | ORAL | Status: DC
Start: 2013-09-30 — End: 2013-10-01
  Administered 2013-09-30 – 2013-10-01 (×2): 40 mg via ORAL
  Filled 2013-09-30 (×3): qty 1

## 2013-09-30 MED ORDER — DEXAMETHASONE SODIUM PHOSPHATE 10 MG/ML IJ SOLN
10.0000 mg | Freq: Once | INTRAMUSCULAR | Status: AC
  Administered 2013-09-30: 10 mg via INTRAVENOUS

## 2013-09-30 MED ORDER — METOCLOPRAMIDE HCL 5 MG/ML IJ SOLN
5.0000 mg | Freq: Four times a day (QID) | INTRAMUSCULAR | Status: DC | PRN
  Administered 2013-09-30: 10 mg via INTRAVENOUS
  Filled 2013-09-30: qty 2

## 2013-09-30 MED ORDER — MIDAZOLAM HCL 5 MG/5ML IJ SOLN
INTRAMUSCULAR | Status: DC | PRN
  Administered 2013-09-30: 2 mg via INTRAVENOUS

## 2013-09-30 MED ORDER — SODIUM CHLORIDE 0.9 % IV BOLUS (SEPSIS)
500.0000 mL | Freq: Once | INTRAVENOUS | Status: AC
  Administered 2013-09-30: 500 mL via INTRAVENOUS

## 2013-09-30 MED ORDER — EPHEDRINE SULFATE 50 MG/ML IJ SOLN
INTRAMUSCULAR | Status: AC
  Filled 2013-09-30: qty 1

## 2013-09-30 MED ORDER — METHOCARBAMOL 1000 MG/10ML IJ SOLN
500.0000 mg | Freq: Four times a day (QID) | INTRAVENOUS | Status: DC | PRN
  Administered 2013-09-30: 500 mg via INTRAVENOUS
  Filled 2013-09-30: qty 5

## 2013-09-30 MED ORDER — SODIUM CHLORIDE 0.9 % IJ SOLN
INTRAMUSCULAR | Status: AC
  Filled 2013-09-30: qty 10

## 2013-09-30 MED ORDER — PROMETHAZINE HCL 25 MG/ML IJ SOLN: 6.2500 mg | INTRAMUSCULAR | Status: DC | PRN

## 2013-09-30 MED ORDER — MEPERIDINE HCL 50 MG/ML IJ SOLN: 6.2500 mg | INTRAMUSCULAR | Status: DC | PRN

## 2013-09-30 MED ORDER — SENNA 8.6 MG PO TABS
1.0000 | ORAL_TABLET | Freq: Two times a day (BID) | ORAL | Status: DC
  Administered 2013-09-30 – 2013-10-01 (×3): 8.6 mg via ORAL

## 2013-09-30 MED ORDER — CEFAZOLIN SODIUM-DEXTROSE 2-3 GM-% IV SOLR
2.0000 g | INTRAVENOUS | Status: AC
Start: 2013-09-30 — End: 2013-09-30
  Administered 2013-09-30: 2 g via INTRAVENOUS

## 2013-09-30 MED ORDER — ONDANSETRON HCL 4 MG/2ML IJ SOLN
INTRAMUSCULAR | Status: AC
  Filled 2013-09-30: qty 2

## 2013-09-30 SURGICAL SUPPLY — 37 items
BAG ZIPLOCK 12X15 (MISCELLANEOUS) IMPLANT
CAPT HIP PF COP ×2 IMPLANT
COVER PERINEAL POST (MISCELLANEOUS) ×2 IMPLANT
DERMABOND ADVANCED (GAUZE/BANDAGES/DRESSINGS) ×1
DERMABOND ADVANCED .7 DNX12 (GAUZE/BANDAGES/DRESSINGS) ×1 IMPLANT
DRAPE C-ARM 42X120 X-RAY (DRAPES) ×2 IMPLANT
DRAPE STERI IOBAN 125X83 (DRAPES) ×2 IMPLANT
DRAPE U-SHAPE 47X51 STRL (DRAPES) ×6 IMPLANT
DRSG AQUACEL AG ADV 3.5X10 (GAUZE/BANDAGES/DRESSINGS) ×2 IMPLANT
DURAPREP 26ML APPLICATOR (WOUND CARE) ×2 IMPLANT
ELECT BLADE TIP CTD 4 INCH (ELECTRODE) ×2 IMPLANT
ELECT PENCIL ROCKER SW 15FT (MISCELLANEOUS) ×2 IMPLANT
ELECT REM PT RETURN 15FT ADLT (MISCELLANEOUS) ×2 IMPLANT
FACESHIELD WRAPAROUND (MASK) ×8 IMPLANT
GLOVE BIOGEL PI IND STRL 7.5 (GLOVE) ×1 IMPLANT
GLOVE BIOGEL PI IND STRL 8 (GLOVE) ×1 IMPLANT
GLOVE BIOGEL PI INDICATOR 7.5 (GLOVE) ×1
GLOVE BIOGEL PI INDICATOR 8 (GLOVE) ×1
GLOVE ECLIPSE 8.0 STRL XLNG CF (GLOVE) ×2 IMPLANT
GLOVE ORTHO TXT STRL SZ7.5 (GLOVE) ×4 IMPLANT
GOWN SPEC L3 XXLG W/TWL (GOWN DISPOSABLE) ×2 IMPLANT
GOWN STRL REUS W/TWL LRG LVL3 (GOWN DISPOSABLE) ×2 IMPLANT
KIT BASIN OR (CUSTOM PROCEDURE TRAY) ×2 IMPLANT
LINER BOOT UNIVERSAL DISP (MISCELLANEOUS) IMPLANT
NS IRRIG 1000ML POUR BTL (IV SOLUTION) ×2 IMPLANT
PACK TOTAL JOINT (CUSTOM PROCEDURE TRAY) ×2 IMPLANT
PADDING CAST COTTON 6X4 STRL (CAST SUPPLIES) IMPLANT
SAW OSC TIP CART 19.5X105X1.3 (SAW) ×2 IMPLANT
SUT MNCRL AB 4-0 PS2 18 (SUTURE) ×2 IMPLANT
SUT VIC AB 1 CT1 36 (SUTURE) ×6 IMPLANT
SUT VIC AB 2-0 CT1 27 (SUTURE) ×2
SUT VIC AB 2-0 CT1 TAPERPNT 27 (SUTURE) ×2 IMPLANT
SUT VLOC 180 0 24IN GS25 (SUTURE) ×2 IMPLANT
TOWEL OR 17X26 10 PK STRL BLUE (TOWEL DISPOSABLE) ×2 IMPLANT
TOWEL OR NON WOVEN STRL DISP B (DISPOSABLE) IMPLANT
TRAY FOLEY CATH 14FRSI W/METER (CATHETERS) ×2 IMPLANT
WATER STERILE IRR 1500ML POUR (IV SOLUTION) ×2 IMPLANT

## 2013-09-30 NOTE — Interval H&P Note (Signed)
History and Physical Interval Note:  09/30/2013 7:08 AM  Rose Burgess  has presented today for surgery, with the diagnosis of right hip osteoarthritis  The various methods of treatment have been discussed with the patient and family. After consideration of risks, benefits and other options for treatment, the patient has consented to  Procedure(s): RIGHT TOTAL HIP ARTHROPLASTY ANTERIOR APPROACH (Right) as a surgical intervention .  The patient's history has been reviewed, patient examined, no change in status, stable for surgery.  I have reviewed the patient's chart and labs.  Questions were answered to the patient's satisfaction.     Shelda PalMatthew D Dujuan Stankowski

## 2013-09-30 NOTE — Evaluation (Signed)
Physical Therapy Evaluation Patient Details Name: Rose Burgess MRN: [id number] DOB: 07/21/1945 Today's Date: 09/30/2013   History of Present Illness  R THA -direct anterior approach  Clinical Impression  **Pt is s/p THA resulting in the deficits listed below (see PT Problem List). ** Pt will benefit from skilled PT to increase their independence and safety with mobility to allow discharge to the venue listed below.   Pt felt faint in sitting. BP 85/56, SaO2 80% on RA. Assisted pt to supine, placed pt in Trendelenberg position,  applied O2, sats came up to 100% on 2L.  BP up to 98/57. Expect good progress.    *    Follow Up Recommendations Home health PT    Equipment Recommendations  Rolling walker with 5" wheels;3in1 (PT)    Recommendations for Other Services       Precautions / Restrictions Precautions Precautions: Fall Precaution Comments: dizzy on eval Restrictions Weight Bearing Restrictions: No Other Position/Activity Restrictions: WBAT      Mobility  Bed Mobility Overal bed mobility: Needs Assistance Bed Mobility: Supine to Sit     Supine to sit: Mod assist     General bed mobility comments: assist to raise trunk  Transfers                 General transfer comment: NT -pt dizzy and with low BP (65/56) in sitting. SaO2 80% on RA, applied 4L O2, SaO2 up to 100%.  REturned to supine. RN and MD aware.  Ambulation/Gait                Stairs            Wheelchair Mobility    Modified Rankin (Stroke Patients Only)       Balance                                             Pertinent Vitals/Pain **"it's just sore" -R hip Premedicated Ice applied*    Home Living Family/patient expects to be discharged to:: Private residence Living Arrangements: Spouse/significant other Available Help at Discharge: Family;Available 24 hours/day Type of Home: House Home Access: Stairs to enter Entrance Stairs-Rails:  None Entrance Stairs-Number of Steps: 1 Home Layout: Two level;Able to live on main level with bedroom/bathroom Home Equipment: None      Prior Function Level of Independence: Independent               Hand Dominance        Extremity/Trunk Assessment   Upper Extremity Assessment: Overall WFL for tasks assessed           Lower Extremity Assessment: RLE deficits/detail RLE Deficits / Details: tolerated 40* hip flexion AAROM and 25* hip aBDuction AAROM, ankle WNL, knee ext not tested 2* dizziness upon sitting       Communication   Communication: No difficulties  Cognition Arousal/Alertness: Awake/alert Behavior During Therapy: WFL for tasks assessed/performed Overall Cognitive Status: Within Functional Limits for tasks assessed                      General Comments      Exercises Total Joint Exercises Ankle Circles/Pumps: AROM;Both;10 reps;Supine Quad Sets: AROM;Both;5 reps;Supine      Assessment/Plan    PT Assessment Patient needs continued PT services  PT Diagnosis Difficulty walking;Acute pain   PT Problem List Decreased activity tolerance;Pain;Decreased  mobility;Decreased knowledge of use of DME;Decreased strength  PT Treatment Interventions DME instruction;Gait training;Stair training;Functional mobility training;Therapeutic activities;Patient/family education;Therapeutic exercise   PT Goals (Current goals can be found in the Care Plan section) Acute Rehab PT Goals Patient Stated Goal: return to independence with mobility PT Goal Formulation: With patient/family Time For Goal Achievement: 10/07/13 Potential to Achieve Goals: Good    Frequency 7X/week   Barriers to discharge        Co-evaluation               End of Session   Activity Tolerance: Treatment limited secondary to medical complications (Comment) (hypotension, dizziness) Patient left: in bed;with call bell/phone within reach;with family/visitor present Nurse  Communication: Mobility status         Time: [id number] PT Time Calculation (min): 22 min   Charges:   PT Evaluation $Initial PT Evaluation Tier I: 1 Procedure PT Treatments $Therapeutic Activity: 8-22 mins   PT G CodesAlvester Burgess:          Rose Burgess 09/30/2013, 3:31 PM  [phone]

## 2013-09-30 NOTE — Op Note (Signed)
NAME:  Rose EmBonnie B Gear                ACCOUNT NO.: [id number]      MEDICAL RECORD NO.: [id number]      [facility]      PHYSICIAN:  Shelda PalMatthew D Chamya Hunton  DATE OF BIRTH:  12/19/1945     DATE OF PROCEDURE:  09/30/2013                                 OPERATIVE REPORT         PREOPERATIVE DIAGNOSIS: Right  hip osteoarthritis.      POSTOPERATIVE DIAGNOSIS:  Right hip osteoarthritis.      PROCEDURE:  Right total hip replacement through an anterior approach   utilizing DePuy THR system, component size 50mm pinnacle cup, a size 32+4 neutral   Altrex liner, a size 1 Hi Tri Lock stem with a 32+1 delta ceramic   ball.      SURGEON:  Madlyn FrankelMatthew D. Charlann Boxerlin, M.D.      ASSISTANT:  Leilani AbleSteve Chabon, PA-C     ANESTHESIA:  Spinal.      SPECIMENS:  None.      COMPLICATIONS:  None.      BLOOD LOSS:  400 cc     DRAINS:  None.      INDICATION OF THE PROCEDURE:  Rose Burgess is a 68 y.o. female who had   presented to office for evaluation of right hip pain.  Radiographs revealed   progressive degenerative changes with bone-on-bone   articulation to the  hip joint.  The patient had painful limited range of   motion significantly affecting their overall quality of life.  The patient was failing to    respond to conservative measures, and at this point was ready   to proceed with more definitive measures.  The patient has noted progressive   degenerative changes in his hip, progressive problems and dysfunction   with regarding the hip prior to surgery.  Consent was obtained for   benefit of pain relief.  Specific risk of infection, DVT, component   failure, dislocation, need for revision surgery, as well discussion of   the anterior versus posterior approach were reviewed.  Consent was   obtained for benefit of anterior pain relief through an anterior   approach.      PROCEDURE IN DETAIL:  The patient was brought to operative theater.   Once adequate anesthesia, preoperative  antibiotics, 2gm Ancef administered.   The patient was positioned supine on the OSI Hanna table.  Once adequate   padding of boney process was carried out, we had predraped out the hip, and  used fluoroscopy to confirm orientation of the pelvis and position.      The right hip was then prepped and draped from proximal iliac crest to   mid thigh with shower curtain technique.      Time-out was performed identifying the patient, planned procedure, and   extremity.     An incision was then made 2 cm distal and lateral to the   anterior superior iliac spine extending over the orientation of the   tensor fascia lata muscle and sharp dissection was carried down to the   fascia of the muscle and protractor placed in the soft tissues.      The fascia was then incised.  The muscle belly was identified and swept  laterally and retractor placed along the superior neck.  Following   cauterization of the circumflex vessels and removing some pericapsular   fat, a second cobra retractor was placed on the inferior neck.  A third   retractor was placed on the anterior acetabulum after elevating the   anterior rectus.  A L-capsulotomy was along the line of the   superior neck to the trochanteric fossa, then extended proximally and   distally.  Tag sutures were placed and the retractors were then placed   intracapsular.  We then identified the trochanteric fossa and   orientation of my neck cut, confirmed this radiographically   and then made a neck osteotomy with the femur on traction.  The femoral   head was removed without difficulty or complication.  Traction was let   off and retractors were placed posterior and anterior around the   acetabulum.      The labrum and foveal tissue were debrided.  I began reaming with a 47mm   reamer and reamed up to 49mm reamer with good bony bed preparation and a 50   cup was chosen.  The final 50mm Pinnacle cup was then impacted under fluoroscopy  to confirm the  depth of penetration and orientation with respect to   abduction.  A screw was placed followed by the hole eliminator.  The final   32+4 neutral Altrex liner was impacted with good visualized rim fit.  The cup was positioned anatomically within the acetabular portion of the pelvis.      At this point, the femur was rolled at 80 degrees.  Further capsule was   released off the inferior aspect of the femoral neck.  I then   released the superior capsule proximally.  The hook was placed laterally   along the femur and elevated manually and held in position with the bed   hook.  The leg was then extended and adducted with the leg rolled to 100   degrees of external rotation.  Once the proximal femur was fully   exposed, I used a box osteotome to set orientation.  I then began   broaching with the starting chili pepper broach and passed this by hand and then broached up to a size 1.  With the size 1 broach in place I chose a high offset neck and did a trial reduction.  The offset was appropriate, leg lengths   appeared to be equal, confirmed radiographically.   Given these findings, I went ahead and dislocated the hip, repositioned all   retractors and positioned the right hip in the extended and abducted position.  The final 1 Hi Tri Lock stem was   chosen and it was impacted down to the level of neck cut.  Based on this   and the trial reduction, a 32+1 delta ceramic ball was chosen and   impacted onto a clean and dry trunnion, and the hip was reduced.  The   hip had been irrigated throughout the case again at this point.  I did   reapproximate the superior capsular leaflet to the anterior leaflet   using #1 Vicryl.  The fascia of the   tensor fascia lata muscle was then reapproximated using #1 Vicryl and #0 V-lock sutures.  The   remaining wound was closed with 2-0 Vicryl and running 4-0 Monocryl.   The hip was cleaned, dried, and dressed sterilely using Dermabond and   Aquacel dressing.  She  was then brought  to recovery room in stable condition tolerating the procedure well.    Leilani AbleSteve Chabon, PA-C was present for the entirety of the case involved from   preoperative positioning, perioperative retractor management, general   facilitation of the case, as well as primary wound closure as assistant.            Madlyn FrankelMatthew D. Charlann Boxerlin, M.D.        09/30/2013 8:50 AM

## 2013-09-30 NOTE — Transfer of Care (Signed)
Immediate Anesthesia Transfer of Care Note  Patient: Rose Burgess  Procedure(s) Performed: Procedure(s): RIGHT TOTAL HIP ARTHROPLASTY ANTERIOR APPROACH (Right)  Patient Location: PACU  Anesthesia Type:Spinal  Level of Consciousness: awake, alert  and oriented  Airway & Oxygen Therapy: Patient Spontanous Breathing and Patient connected to face mask oxygen  Post-op Assessment: Report given to PACU RN and Post -op Vital signs reviewed and stable  Post vital signs: Reviewed and stable  Complications: No apparent anesthesia complications

## 2013-09-30 NOTE — Progress Notes (Signed)
Portable AP Pelvis and Lateral Right Hip X-rays done. 

## 2013-09-30 NOTE — Anesthesia Procedure Notes (Addendum)
Spinal  Patient location during procedure: OR Staffing Anesthesiologist: Montez Hageman Performed by: anesthesiologist  Preanesthetic Checklist Completed: patient identified, site marked, surgical consent, pre-op evaluation, timeout performed, IV checked, risks and benefits discussed and monitors and equipment checked Spinal Block Patient position: sitting Prep: Betadine Patient monitoring: heart rate, continuous pulse ox and blood pressure Approach: right paramedian Location: L4-5 Injection technique: single-shot Needle Needle type: Sprotte  Needle gauge: 24 G Needle length: 10 cm Additional Notes Expiration date of kit checked and confirmed. Patient tolerated procedure well, without complications.

## 2013-09-30 NOTE — Progress Notes (Signed)
Utilization review completed.  

## 2013-09-30 NOTE — Progress Notes (Signed)
X-ray results noted 

## 2013-09-30 NOTE — Anesthesia Postprocedure Evaluation (Signed)
  Anesthesia Post-op Note  Patient: Rose Burgess  Procedure(s) Performed: Procedure(s) (LRB): RIGHT TOTAL HIP ARTHROPLASTY ANTERIOR APPROACH (Right)  Patient Location: PACU  Anesthesia Type: Spinal  Level of Consciousness: awake and alert   Airway and Oxygen Therapy: Patient Spontanous Breathing  Post-op Pain: mild  Post-op Assessment: Post-op Vital signs reviewed, Patient's Cardiovascular Status Stable, Respiratory Function Stable, Patent Airway and No signs of Nausea or vomiting  Last Vitals:  Filed Vitals:   09/30/13 1227  BP: 101/62  Pulse: 58  Temp: 36.3 C  Resp: 16    Post-op Vital Signs: stable   Complications: No apparent anesthesia complications

## 2013-09-30 NOTE — Anesthesia Preprocedure Evaluation (Addendum)
Anesthesia Evaluation  Patient identified by MRN, date of birth, ID band Patient awake    Reviewed: Allergy & Precautions, H&P , NPO status , Patient's Chart, lab work & pertinent test results  Airway Mallampati: II TM Distance: >3 FB Neck ROM: Full    Dental no notable dental hx.    Pulmonary neg pulmonary ROS,  breath sounds clear to auscultation  Pulmonary exam normal       Cardiovascular hypertension, Pt. on medications negative cardio ROS  Rhythm:Regular Rate:Normal     Neuro/Psych negative neurological ROS  negative psych ROS   GI/Hepatic negative GI ROS, Neg liver ROS, hiatal hernia, GERD-  ,  Endo/Other  negative endocrine ROS  Renal/GU negative Renal ROS  negative genitourinary   Musculoskeletal negative musculoskeletal ROS (+)   Abdominal   Peds negative pediatric ROS (+)  Hematology negative hematology ROS (+)   Anesthesia Other Findings   Reproductive/Obstetrics negative OB ROS                          Anesthesia Physical Anesthesia Plan  ASA: II  Anesthesia Plan: Spinal   Post-op Pain Management:    Induction:   Airway Management Planned: Simple Face Mask  Additional Equipment:   Intra-op Plan:   Post-operative Plan:   Informed Consent: I have reviewed the patients History and Physical, chart, labs and discussed the procedure including the risks, benefits and alternatives for the proposed anesthesia with the patient or authorized representative who has indicated his/her understanding and acceptance.   Dental advisory given  Plan Discussed with: CRNA  Anesthesia Plan Comments:         Anesthesia Quick Evaluation

## 2013-10-01 DIAGNOSIS — E669 Obesity, unspecified: Secondary | ICD-10-CM | POA: Diagnosis present

## 2013-10-01 DIAGNOSIS — D500 Iron deficiency anemia secondary to blood loss (chronic): Secondary | ICD-10-CM | POA: Diagnosis not present

## 2013-10-01 LAB — BASIC METABOLIC PANEL
BUN: 12 mg/dL (ref 6–23)
CO2: 27 mEq/L (ref 19–32)
CREATININE: 0.61 mg/dL (ref 0.50–1.10)
Calcium: 8.9 mg/dL (ref 8.4–10.5)
Chloride: 102 mEq/L (ref 96–112)
Glucose, Bld: 119 mg/dL — ABNORMAL HIGH (ref 70–99)
POTASSIUM: 4.7 meq/L (ref 3.7–5.3)
Sodium: 136 mEq/L — ABNORMAL LOW (ref 137–147)

## 2013-10-01 LAB — CBC
HCT: 25.4 % — ABNORMAL LOW (ref 36.0–46.0)
Hemoglobin: 8.6 g/dL — ABNORMAL LOW (ref 12.0–15.0)
MCH: 30.1 pg (ref 26.0–34.0)
MCHC: 33.9 g/dL (ref 30.0–36.0)
MCV: 88.8 fL (ref 78.0–100.0)
Platelets: 234 10*3/uL (ref 150–400)
RBC: 2.86 MIL/uL — ABNORMAL LOW (ref 3.87–5.11)
RDW: 13 % (ref 11.5–15.5)
WBC: 9.1 10*3/uL (ref 4.0–10.5)

## 2013-10-01 MED ORDER — ASPIRIN 325 MG PO TBEC: 325.0000 mg | DELAYED_RELEASE_TABLET | Freq: Two times a day (BID) | ORAL | Status: AC

## 2013-10-01 MED ORDER — FERROUS SULFATE 325 (65 FE) MG PO TABS
325.0000 mg | ORAL_TABLET | Freq: Three times a day (TID) | ORAL | Status: DC
Start: 2013-10-01 — End: 2014-05-12

## 2013-10-01 MED ORDER — POLYETHYLENE GLYCOL 3350 17 G PO PACK: 17.0000 g | PACK | Freq: Every day | ORAL | Status: DC | PRN

## 2013-10-01 MED ORDER — DSS 100 MG PO CAPS: 100.0000 mg | ORAL_CAPSULE | Freq: Two times a day (BID) | ORAL | Status: DC

## 2013-10-01 MED ORDER — TIZANIDINE HCL 4 MG PO CAPS: 4.0000 mg | ORAL_CAPSULE | Freq: Three times a day (TID) | ORAL | Status: DC | PRN

## 2013-10-01 MED ORDER — HYDROCODONE-ACETAMINOPHEN 7.5-325 MG PO TABS: 1.0000 | ORAL_TABLET | ORAL | Status: DC | PRN

## 2013-10-01 NOTE — Progress Notes (Signed)
Physical Therapy Treatment Patient Details Name: Rose Burgess MRN: [id number] DOB: 10/12/1945 Today's Date: 10/01/2013    History of Present Illness R THA -direct anterior approach    PT Comments    Pt will likely be able to D/c today, doing much better  Follow Up Recommendations  Home health PT     Equipment Recommendations  Rolling walker with 5" wheels;3in1 (PT)    Recommendations for Other Services       Precautions / Restrictions Precautions Precautions: Fall Restrictions Weight Bearing Restrictions: No Other Position/Activity Restrictions: WBAT    Mobility  Bed Mobility Overal bed mobility: Needs Assistance Bed Mobility: Supine to Sit     Supine to sit: Supervision     General bed mobility comments: cues for technique  Transfers Overall transfer level: Needs assistance Equipment used: Rolling walker (2 wheeled) Transfers: Sit to/from Stand Sit to Stand: Min guard         General transfer comment: cues for hand placement  Ambulation/Gait Ambulation/Gait assistance: Min guard Ambulation Distance (Feet): 160 Feet Assistive device: Rolling walker (2 wheeled) Gait Pattern/deviations: Step-to pattern;Step-through pattern     General Gait Details: cues for RW position during turns   Careers information officertairs            Wheelchair Mobility    Modified Rankin (Stroke Patients Only)       Balance                                    Cognition Arousal/Alertness: Awake/alert Behavior During Therapy: WFL for tasks assessed/performed Overall Cognitive Status: Within Functional Limits for tasks assessed                      Exercises Total Joint Exercises Ankle Circles/Pumps: AROM;Both;10 reps;Supine Quad Sets: AROM;Strengthening;Both;10 reps Heel Slides: AROM;Right;15 reps    General Comments        Pertinent Vitals/Pain sore    Home Living                      Prior Function            PT Goals (current  goals can now be found in the care plan section) Acute Rehab PT Goals Patient Stated Goal: return to independence with mobility Time For Goal Achievement: 10/07/13 Potential to Achieve Goals: Good Progress towards PT goals: Progressing toward goals    Frequency  7X/week    PT Plan Current plan remains appropriate    Co-evaluation             End of Session Equipment Utilized During Treatment: Gait belt Activity Tolerance: Patient tolerated treatment well Patient left: in chair;with call bell/phone within reach;with family/visitor present     Time: 4098-11910951-1016 PT Time Calculation (min): 25 min  Charges:  $Gait Training: 8-22 mins $Therapeutic Exercise: 8-22 mins                    G Codes:      Caswell Corwinara N Shamond Skelton 10/01/2013, 10:24 AM

## 2013-10-01 NOTE — Evaluation (Signed)
Occupational Therapy Evaluation Patient Details Name: Rose Burgess MRN: [id number] DOB: 09/06/1945 Today's Date: 10/01/2013    History of Present Illness R THA -direct anterior approach   Clinical Impression   Pt requires mod - max A with LB ADLs and min guard A with ADL mobility using RW. Pt will have 3 in 1 and RW to take home at d/c and her husband will be able to provide 24 hr assist. Provided with education on DME for bathroom safety and ADL A/E for home use. All education completed and no further acute OT services indicated at this time    Follow Up Recommendations  No OT follow up    Equipment Recommendations  3 in 1 bedside comode;Tub/shower seat    Recommendations for Other Services       Precautions / Restrictions Precautions Precautions: Fall Restrictions Weight Bearing Restrictions: No Other Position/Activity Restrictions: WBAT      Mobility Bed Mobility Overal bed mobility: Needs Assistance Bed Mobility: Supine to Sit     Supine to sit: Supervision     General bed mobility comments: pt up in recliner upon entering rooom  Transfers Overall transfer level: Needs assistance Equipment used: Rolling walker (2 wheeled) Transfers: Sit to/from Stand Sit to Stand: Min guard         General transfer comment: cues for hand placement    Balance Overall balance assessment: No apparent balance deficits (not formally assessed)                                          ADL Overall ADL's : Needs assistance/impaired     Grooming: Wash/dry face;Wash/dry hands;Standing;Min guard   Upper Body Bathing: Set up;Sitting   Lower Body Bathing: Moderate assistance;Sitting/lateral leans;Sit to/from stand   Upper Body Dressing : Set up;Sitting   Lower Body Dressing: Maximal assistance;Sitting/lateral leans;Sit to/from stand   Toilet Transfer: Min guard;Regular Toilet;Grab bars;RW   Toileting- ArchitectClothing Manipulation and Hygiene: Minimal  assistance;Sit to/from stand       Functional mobility during ADLs: Min guard;Rolling walker General ADL Comments: Pt and her husband educated on use of tub transfer bench an provided with picture. Provided education and demo of ADL A/E for home use                 Pertinent Vitals/Pain 2/10 R LE pain, VSS     Hand Dominance Right   Extremity/Trunk Assessment Upper Extremity Assessment Upper Extremity Assessment: Overall WFL for tasks assessed   Lower Extremity Assessment Lower Extremity Assessment: Defer to PT evaluation   Cervical / Trunk Assessment Cervical / Trunk Assessment: Normal   Communication Communication Communication: No difficulties   Cognition Arousal/Alertness: Awake/alert Behavior During Therapy: WFL for tasks assessed/performed Overall Cognitive Status: Within Functional Limits for tasks assessed                     General Comments   Pt very pleasant and cooperative                 Home Living Family/patient expects to be discharged to:: Private residence Living Arrangements: Spouse/significant other Available Help at Discharge: Family;Available 24 hours/day Type of Home: House Home Access: Stairs to enter Entergy CorporationEntrance Stairs-Number of Steps: 1 Entrance Stairs-Rails: None Home Layout: Two level;Able to live on main level with bedroom/bathroom Alternate Level Stairs-Number of Steps: flight   Bathroom Shower/Tub: Tub/shower unit  Bathroom Toilet: Standard     Home Equipment: None          Prior Functioning/Environment Level of Independence: Independent             OT Diagnosis:     OT Problem List:     OT Treatment/Interventions:      OT Goals(Current goals can be found in the care plan section) Acute Rehab OT Goals Patient Stated Goal: return to independence with mobility  OT Frequency:     Barriers to D/C:  none                        End of Session Equipment Utilized During Treatment: Rolling  walker  Activity Tolerance: Patient tolerated treatment well Patient left: in chair;with call bell/phone within reach;with family/visitor present   Time: 4098-11911016-1048 OT Time Calculation (min): 32 min Charges:  OT General Charges $OT Visit: 1 Procedure OT Evaluation $Initial OT Evaluation Tier I: 1 Procedure OT Treatments $Self Care/Home Management : 8-22 mins $Therapeutic Activity: 8-22 mins G-Codes:    Lafe GarinDenise J Shia Eber 10/01/2013, 12:53 PM

## 2013-10-01 NOTE — Care Management Note (Unsigned)
    Page 1 of 2   10/01/2013     11:22:33 AM CARE MANAGEMENT NOTE 10/01/2013  Patient:  MAZELLA, DEEN   Account Number:  [id number]  Date Initiated:  10/01/2013  Documentation initiated by:  [facility]  Subjective/Objective Assessment:   adm: Right hip OA/pain; R TOTAL HIP ARTHROPLASTY ANTERIOR APPROACH     Action/Plan:   discharge planning   Anticipated DC Date:  10/01/2013   Anticipated DC Plan:  [place]  CM consult      [facility] Choice  HOME HEALTH   Choice offered to / List presented to:  C-1 Patient   DME arranged  3-N-1  Vassie Moselle      DME agency  [place] arranged  [place]   Status of service:  Completed, signed off Medicare Important Message given?  YES (If response is "NO", the following Medicare IM given date fields will be blank) Date Medicare IM given:  09/22/2013 Date Additional Medicare IM given:    Discharge Disposition:  [place]  Per UR Regulation:    If discussed at Long Length of Stay Meetings, dates discussed:    Comments:  10/01/13 10:55 Cm met with pt and spouse inroom to offer choice.  Pt chooses [facility] to render HHPT.  Ruthe Mannan on unit and accepts referral.  Address and contact information verified with pt.  DME delivery person contacted for 3n1 and rolling walker to be delivered to room prior to discharge. IM from medicare signed pre-operatively.  No other CM needs were communicated. Mariane Masters, BSN, CM [phone].

## 2013-10-01 NOTE — Progress Notes (Signed)
The patient reports using Flovent diskus on an "as needed" basis prior to admission. This product contains a long-acting medication and is only FDA-approved for scheduled dosing. It is not effective for short term exacerbations. Consider changing it to a scheduled regimen, prescribe it this way upon discharge, and counsel the patient on appropriate usage. Thank you - Pharmacy.  Charolotte Ekeom Betania Dizon, PharmD, pager [phone]. 10/01/2013,6:34 AM.

## 2013-10-01 NOTE — Progress Notes (Signed)
CSW consulted for SNF placement. PN reviewed. PT recommends HHPT following hospital d/c. RNCM will assist with d/c planning.  Bernardino Dowell LCSW [phone] 

## 2013-10-01 NOTE — Progress Notes (Signed)
   Subjective: 1 Day Post-Op Procedure(s) (LRB): RIGHT TOTAL HIP ARTHROPLASTY ANTERIOR APPROACH (Right)   Patient reports pain as mild, pain well controlled. No events throughout the night.  Once incident of being light headed yesterday. Ready to be discharged home if she does well with PT.  Objective:   VITALS:   Filed Vitals:   10/01/13 0518  BP: 132/74  Pulse: 69  Temp: 98.4 F (36.9 C)  Resp: 16    Neurovascular intact Dorsiflexion/Plantar flexion intact Incision: dressing C/D/I No cellulitis present Compartment soft  LABS  Recent Labs  10/01/13 0403  HGB 8.6*  HCT 25.4*  WBC 9.1  PLT 234     Recent Labs  10/01/13 0403  NA 136*  K 4.7  BUN 12  CREATININE 0.61  GLUCOSE 119*     Assessment/Plan: 1 Day Post-Op Procedure(s) (LRB): RIGHT TOTAL HIP ARTHROPLASTY ANTERIOR APPROACH (Right) Foley cath d/c'ed Advance diet Up with therapy D/C IV fluids Discharge home with home health [facility]. Follow up with OLIN,Manvi Guilliams D in 2 weeks.  Contact information:  [facility] [address], [address] [place] [id number] [phone]    Expected ABLA  Treated with iron and will observe  Obese (BMI 30-39.9) Estimated body mass index is 32.22 kg/(m^2) as calculated from the following:   Height as of this encounter: 5' (1.524 m).   Weight as of this encounter: 74.844 kg (165 lb). Patient also counseled that weight may inhibit the healing process Patient counseled that losing weight will help with future health issues         Anastasio AuerbachMatthew S. Anton Cheramie   PAC  10/01/2013, 8:44 AM

## 2013-10-03 NOTE — Discharge Summary (Signed)
Physician Discharge Summary  Patient ID: Rose Burgess MRN: [id number] DOB/AGE: 70/09/1945 70 y.o.  Admit date: 09/30/2013 Discharge date: 10/01/2013   Procedures:  Procedure(s) (LRB): RIGHT TOTAL HIP ARTHROPLASTY ANTERIOR APPROACH (Right)  Attending Physician:  Dr. Durene RomansMatthew Olin   Admission Diagnoses:   Right hip OA / pain  Discharge Diagnoses:  Principal Problem:   S/P right THA, AA Active Problems:   S/P total hip arthroplasty   Obese   Expected blood loss anemia  Past Medical History  Diagnosis Date  . Hypertension   . Diverticulosis   . GERD (gastroesophageal reflux disease)   . H/O hiatal hernia   . Arthritis   . History of blood transfusion     07/2012 - several blood transfusions per patient     HPI: Rose EmBonnie B Buxton, 68 y.o. female, has a history of pain and functional disability in the right hip(s) due to arthritis and patient has failed non-surgical conservative treatments for greater than 12 weeks to include NSAID's and/or analgesics, corticosteriod injections and activity modification. Onset of symptoms was gradual starting 1 years ago with gradually worsening course since that time.The patient noted no past surgery on the right hip(s). Patient currently rates pain in the right hip at 10 out of 10 with activity. Patient has worsening of pain with activity and weight bearing, trendelenberg gait, pain that interfers with activities of daily living and pain with passive range of motion. Patient has evidence of periarticular osteophytes and joint space narrowing by imaging studies. This condition presents safety issues increasing the risk of falls. There is no current active infection. Risks, benefits and expectations were discussed with the patient. Risks including but not limited to the risk of anesthesia, blood clots, nerve damage, blood vessel damage, failure of the prosthesis, infection and up to and including death. Patient understand the risks, benefits and  expectations and wishes to proceed with surgery.   PCP: Pamelia HoitWILSON,FRED HENRY, MD   Discharged Condition: good  Hospital Course:  Patient underwent the above stated procedure on 09/30/2013. Patient tolerated the procedure well and brought to the recovery room in good condition and subsequently to the floor.  POD #1 BP: 132/74 ; Pulse: 69 ; Temp: 98.4 F (36.9 C) ; Resp: 16 Patient reports pain as mild, pain well controlled. No events throughout the night. Once incident of being light headed yesterday. Ready to be discharged home if she does well with PT. Neurovascular intact, dorsiflexion/plantar flexion intact, incision: dressing C/D/I, no cellulitis present and compartment soft.   LABS  Basename    HGB  8.6  HCT  25.4    Discharge Exam: General appearance: alert, cooperative and no distress Extremities: Homans sign is negative, no sign of DVT, no edema, redness or tenderness in the calves or thighs and no ulcers, gangrene or trophic changes  Disposition:     Home with follow up in 2 weeks   Follow-up Information   Follow up with Shelda PalLIN,Deontrey Massi D, MD. Schedule an appointment as soon as possible for a visit in 2 weeks.   Specialty:  Orthopedic Surgery   Contact information:   [address] [place] [address] [phone]       Discharge Instructions   Call MD / Call 911    Complete by:  As directed   If you experience chest pain or shortness of breath, CALL 911 and be transported to the hospital emergency room.  If you develope a fever above 101 F, pus (white drainage) or  increased drainage or redness at the wound, or calf pain, call your surgeon's office.     Change dressing    Complete by:  As directed   Maintain surgical dressing for 10-14 days, or until follow up in the clinic.     Constipation Prevention    Complete by:  As directed   Drink plenty of fluids.  Prune juice may be helpful.  You may use a stool softener, such as Colace (over the counter) 100  mg twice a day.  Use MiraLax (over the counter) for constipation as needed.     Diet - low sodium heart healthy    Complete by:  As directed      Discharge instructions    Complete by:  As directed   Maintain surgical dressing for 10-14 days, or until follow up in the clinic. [facility]. Call with any questions or concerns.     Increase activity slowly as tolerated    Complete by:  As directed      TED hose    Complete by:  As directed   Use stockings (TED hose) for 2 weeks on both leg(s).  You may remove them at night for sleeping.     Weight bearing as tolerated    Complete by:  As directed              Medication List    STOP taking these medications       acetaminophen 500 MG tablet  Commonly known as:  TYLENOL     diclofenac 75 MG EC tablet  Commonly known as:  VOLTAREN      TAKE these medications       aspirin 325 MG EC tablet  Take 1 tablet (325 mg total) by mouth 2 (two) times daily.     atorvastatin 10 MG tablet  Commonly known as:  LIPITOR  Take 10 mg by mouth every evening.     BIOTIN 5000 PO  Take 1 capsule by mouth daily.     CALCIUM 600 + D PO  Take 1 tablet by mouth 2 (two) times daily.     DSS 100 MG Caps  Take 100 mg by mouth 2 (two) times daily.     ferrous sulfate 325 (65 FE) MG tablet  Take 1 tablet (325 mg total) by mouth 3 (three) times daily after meals.     fluticasone 50 MCG/BLIST diskus inhaler  Commonly known as:  FLOVENT DISKUS  Inhale 1 puff into the lungs 2 (two) times daily. As needed per patient     glucosamine-chondroitin 500-400 MG tablet  Take 2 tablets by mouth daily.     HYDROcodone-acetaminophen 7.5-325 MG per tablet  Commonly known as:  NORCO  Take 1-2 tablets by mouth every 4 (four) hours as needed for moderate pain (breakthrough pain).     lisinopril-hydrochlorothiazide 20-25 MG per tablet  Commonly known as:  PRINZIDE,ZESTORETIC  Take 1 tablet by mouth every morning.      multivitamin with minerals Tabs tablet  Take 1 tablet by mouth daily.     Omega 3 1200 MG Caps  Take 2 capsules by mouth daily.     omeprazole 20 MG capsule  Commonly known as:  PRILOSEC  Take 20 mg by mouth daily.     polyethylene glycol packet  Commonly known as:  MIRALAX / GLYCOLAX  Take 17 g by mouth daily as needed for mild constipation.     tiZANidine  4 MG capsule  Commonly known as:  ZANAFLEX  Take 1 capsule (4 mg total) by mouth 3 (three) times daily as needed for muscle spasms.     vitamin B-12 500 MCG tablet  Commonly known as:  CYANOCOBALAMIN  Take 500 mcg by mouth daily.         Signed: Anastasio Auerbach. Jalexis Breed   PAC  10/03/2013, 8:27 AM

## 2014-01-31 ENCOUNTER — Encounter (HOSPITAL_COMMUNITY): Payer: Self-pay | Admitting: Emergency Medicine

## 2014-01-31 ENCOUNTER — Emergency Department (HOSPITAL_COMMUNITY)
Admission: EM | Admit: 2014-01-31 | Discharge: 2014-01-31 | Disposition: A | Discharge status: Home / Self Care | Payer: Medicare Other | Attending: Emergency Medicine | Admitting: Emergency Medicine

## 2014-01-31 DIAGNOSIS — I10 Essential (primary) hypertension: Secondary | ICD-10-CM | POA: Insufficient documentation

## 2014-01-31 DIAGNOSIS — Z79899 Other long term (current) drug therapy: Secondary | ICD-10-CM | POA: Insufficient documentation

## 2014-01-31 DIAGNOSIS — H579 Unspecified disorder of eye and adnexa: Secondary | ICD-10-CM | POA: Diagnosis present

## 2014-01-31 DIAGNOSIS — H43391 Other vitreous opacities, right eye: Secondary | ICD-10-CM

## 2014-01-31 DIAGNOSIS — H43399 Other vitreous opacities, unspecified eye: Secondary | ICD-10-CM | POA: Insufficient documentation

## 2014-01-31 DIAGNOSIS — M129 Arthropathy, unspecified: Secondary | ICD-10-CM | POA: Diagnosis not present

## 2014-01-31 DIAGNOSIS — K219 Gastro-esophageal reflux disease without esophagitis: Secondary | ICD-10-CM | POA: Insufficient documentation

## 2014-01-31 MED ORDER — PROPARACAINE HCL 0.5 % OP SOLN
1.0000 [drp] | Freq: Once | OPHTHALMIC | Status: AC
  Administered 2014-01-31: 1 [drp] via OPHTHALMIC
  Filled 2014-01-31: qty 15

## 2014-01-31 NOTE — ED Notes (Signed)
Patient discharged using the teach back method,she verbalizes an understanding 

## 2014-01-31 NOTE — ED Provider Notes (Signed)
CSN: [id number]     Arrival date & time 01/31/14  0932 History   This chart was scribed for a non-physician practitioner, Arthor Captain, PA-C working with Glynn Octave, MD by [place] Peace, ED Scribe. The patient was seen in TR04C/TR04C. The patient's care was started at 10:25 AM.      Chief Complaint  Patient presents with  . Eye Problem      The history is provided by the patient and the spouse. No language interpreter was used.   HPI Comments: Rose Burgess is a 68 y.o. female who presents to the Emergency Department complaining of continuous episodes of visualizing unusual streaks of light specifically in the lateral aspect of her right eye onset with associated "floaters" and "white spots" in her vision as well. Her husband states that they have contacted their eye doctor about current problem but were told they would not be able to be seen until Monday. She reports not feeling "100%" yesterday before episodes started. Pt denies any visual loss. She denies injury to eye or history of DM. Pt further denies any signs of stroke. Pt is non-smoker.    Past Medical History  Diagnosis Date  . Hypertension   . Diverticulosis   . GERD (gastroesophageal reflux disease)   . H/O hiatal hernia   . Arthritis   . History of blood transfusion     07/2012 - several blood transfusions per patient    Past Surgical History  Procedure Laterality Date  . Colon surgery for devierticulosis     . Tubal ligation    . Total hip arthroplasty Right 09/30/2013    Procedure: RIGHT TOTAL HIP ARTHROPLASTY ANTERIOR APPROACH;  Surgeon: Shelda Pal, MD;  Location: [facility];  Service: Orthopedics;  Laterality: Right;   No family history on file. History  Substance Use Topics  . Smoking status: Never Smoker   . Smokeless tobacco: Never Used  . Alcohol Use: No   OB History   Grav Para Term Preterm Abortions TAB SAB Ect Mult Living                 Review of Systems  Eyes: Positive for visual  disturbance. Negative for pain, discharge, redness and itching.  Gastrointestinal: Negative for nausea and vomiting.  Neurological: Negative for tremors, seizures, facial asymmetry and numbness.      Allergies  Review of patient's allergies indicates no known allergies.  Home Medications   Prior to Admission medications   Medication Sig Start Date End Date Taking? Authorizing Provider  atorvastatin (LIPITOR) 10 MG tablet Take 10 mg by mouth every evening.    Historical Provider, MD  BIOTIN 5000 PO Take 1 capsule by mouth daily.    Historical Provider, MD  Calcium Carb-Cholecalciferol (CALCIUM 600 + D PO) Take 1 tablet by mouth 2 (two) times daily.    Historical Provider, MD  docusate sodium 100 MG CAPS Take 100 mg by mouth 2 (two) times daily. 10/01/13   Genelle Gather Babish, PA-C  ferrous sulfate 325 (65 FE) MG tablet Take 1 tablet (325 mg total) by mouth 3 (three) times daily after meals. 10/01/13   Genelle Gather Babish, PA-C  fluticasone (FLOVENT DISKUS) 50 MCG/BLIST diskus inhaler Inhale 1 puff into the lungs 2 (two) times daily. As needed per patient    Historical Provider, MD  glucosamine-chondroitin 500-400 MG tablet Take 2 tablets by mouth daily.    Historical Provider, MD  HYDROcodone-acetaminophen (NORCO) 7.5-325 MG per tablet Take 1-2 tablets by mouth  every 4 (four) hours as needed for moderate pain (breakthrough pain). 10/01/13   Genelle Gather Babish, PA-C  lisinopril-hydrochlorothiazide (PRINZIDE,ZESTORETIC) 20-25 MG per tablet Take 1 tablet by mouth every morning.    Historical Provider, MD  Multiple Vitamin (MULTIVITAMIN WITH MINERALS) TABS tablet Take 1 tablet by mouth daily.    Historical Provider, MD  Omega 3 1200 MG CAPS Take 2 capsules by mouth daily.    Historical Provider, MD  omeprazole (PRILOSEC) 20 MG capsule Take 20 mg by mouth daily.    Historical Provider, MD  polyethylene glycol (MIRALAX / GLYCOLAX) packet Take 17 g by mouth daily as needed for mild constipation.  10/01/13   Genelle Gather Babish, PA-C  tiZANidine (ZANAFLEX) 4 MG capsule Take 1 capsule (4 mg total) by mouth 3 (three) times daily as needed for muscle spasms. 10/01/13   Genelle Gather Babish, PA-C  vitamin B-12 (CYANOCOBALAMIN) 500 MCG tablet Take 500 mcg by mouth daily.    Historical Provider, MD   BP 160/62  Pulse 67  Temp(Src) 97.8 F (36.6 C)  Ht 5' 0.25" (1.53 m)  Wt 163 lb (73.936 kg)  BMI 31.58 kg/m2  SpO2 99% Physical Exam  Nursing note and vitals reviewed. Constitutional: She is oriented to person, place, and time. She appears well-developed and well-nourished. No distress.  HENT:  Head: Normocephalic and atraumatic.  Eyes: Conjunctivae and EOM are normal. Pupils are equal, round, and reactive to light. Right eye exhibits no discharge. Left eye exhibits no discharge. No scleral icterus.  Normal IOP readings of 12 in right eye and 16 in left eye. All visual fields are normal to confrontation.  Visual Acuity - Bilateral Distance: 20/30 with glasses ; R Distance: 20/40 with glasses ; L Distance: 20/50 with glasses  Neck: Neck supple. No tracheal deviation present.  Cardiovascular: Normal rate.   Pulmonary/Chest: Effort normal. No respiratory distress.  Musculoskeletal: Normal range of motion.  Neurological: She is alert and oriented to person, place, and time.  Skin: Skin is warm and dry.  Psychiatric: She has a normal mood and affect. Her behavior is normal.    ED Course  Procedures (including critical care time) Labs Review Labs Reviewed - No data to display  Results for orders placed during the hospital encounter of 09/30/13  CBC      Result Value Ref Range   WBC 9.1  4.0 - 10.5 K/uL   RBC 2.86 (*) 3.87 - 5.11 MIL/uL   Hemoglobin 8.6 (*) 12.0 - 15.0 g/dL   HCT 16.1 (*) 09.6 - 04.5 %   MCV 88.8  78.0 - 100.0 fL   MCH 30.1  26.0 - 34.0 pg   MCHC 33.9  30.0 - 36.0 g/dL   RDW 40.9  81.1 - 91.4 %   Platelets 234  150 - 400 K/uL  BASIC METABOLIC PANEL      Result  Value Ref Range   Sodium 136 (*) 137 - 147 mEq/L   Potassium 4.7  3.7 - 5.3 mEq/L   Chloride 102  96 - 112 mEq/L   CO2 27  19 - 32 mEq/L   Glucose, Bld 119 (*) 70 - 99 mg/dL   BUN 12  6 - 23 mg/dL   Creatinine, Ser 7.82  0.50 - 1.10 mg/dL   Calcium 8.9  8.4 - 95.6 mg/dL   GFR calc non Af Amer >90  >90 mL/min   GFR calc Af Amer >90  >90 mL/min   No results found.  Imaging Review No results found.   EKG Interpretation None     Medications  proparacaine (ALCAINE) 0.5 % ophthalmic solution 1 drop (1 drop Both Eyes Given 01/31/14 0958)   10:33 AM- Treatment plan was discussed with patient who verbalizes understanding and agrees.   MDM   Final diagnoses:  Floaters in visual field, right    Patient seen in shared visit with attending physician. Patient with normal pressures.  I have spoken with Dr. Jim Like has asked to see the patient in his office today in about 1.5 hours for a dilated eye exam    I personally performed the services described in this documentation, which was scribed in my presence. The recorded information has been reviewed and is accurate.     Arthor Captain, PA-C 02/05/14 2125

## 2014-01-31 NOTE — Discharge Instructions (Signed)
You have an appointment with Dr. Cathey Endow at 2:00 PM at [facility]. Pleas be there at 2:00PM  Eye Floaters A jelly-like fluid fills the inside of the eye and is called the vitreous. The vitreous is normally clear. It allows light to pass through to the back of the eye to the tissues that contain the nerves needed for vision (the retina). With age, the vitreous can start to decline. If a decline happens, specks of material from clumps of cells, blood, or other materials may start to float around inside the eye. These objects cast shadows on the retina. These shadows are seen as moving strings, streaks, "bugs," dust or spider webs floating in front of the eye. CAUSES   Age.  A high degree of near-sightedness (high myopia).  Tears in the retina.  Bleeding inside the eye from broken retinal blood vessels as a result of disease (diabetes, inflammation of the retinal blood vessels, and others).  Blood clot of the major vein of the retina or its branches (retinal vein occlusion).  Trauma.  Retinal detachment.  Vitreous detachment.  Eye surgery.  Inflammation inside the eye (uveitis).  Infection inside the eye. SYMPTOMS   Seeing floating specs, dots or spider webs in the vision of one eye. This can sometimes be associated with flashes of light seen off to the side.  Bleeding in the eye may begin as floaters and lead to complete vision loss as the vitreous fills with blood. This may happen repeatedly in certain diseases of the blood vessels of the retina (e.g. diabetes).  If the vitreous shrinks enough to pull away from the retina (posterior vitreous detachment), a small circular ring-shaped floater may be seen. Migraine headaches may be associated with many forms of visual symptoms (sparkling dots, wavy lines) just before the headache strikes. These symptoms due to migraine are not from floaters. They will disappear when the headache goes away. DIAGNOSIS  An eye professional  can tell you if you have floaters during an eye exam. TREATMENT  There is no treatment for the floaters themselves.  If the floaters are due to a tear in the retina, a retinal detachment or other eye disease, the condition that caused the floaters must be treated.  Floaters due to blood in the eye often go away or lessen with time. SEEK MEDICAL CARE IF:   You suddenly see floating dots or spider webs in front of the vision of one or both eyes. This is especially true if you also see flashes of light off to the side (like flashes of lightening).  You see floaters and also notice a change or drop in your vision in either eye. Document Released: 05/11/2003 Document Revised: 07/31/2011 Document Reviewed: 09/05/2007 [facility] Patient Information 2015 [place], [place]. This information is not intended to replace advice given to you by your health care provider. Make sure you discuss any questions you have with your health care provider.

## 2014-01-31 NOTE — ED Notes (Signed)
Pt. Stated, I was seeing white spots and then i had some floaters in my eye.

## 2014-02-06 NOTE — ED Provider Notes (Signed)
Medical screening examination/treatment/procedure(s) were conducted as a shared visit with non-physician practitioner(s) and myself.  I personally evaluated the patient during the encounter.  R eye with increasing flashers and floaters. No visual loss. No headache or injury to eye. PERRLA, IOP normal. EOMI. Beside US shows normal optic nerve, no obvious retinal detachment. D/w ophtho.   EKG Interpretation None       Glynn Octave, MD 02/06/14 [phone]

## 2014-04-29 NOTE — H&P (Signed)
TOTAL HIP ADMISSION H&P  Patient is admitted for left total hip arthroplasty, anterior approach.  Subjective:  Chief Complaint:  Left hip primary OA / pain  HPI: Rose Burgess, 68 y.o. female, has a history of pain and functional disability in the left hip(s) due to arthritis and patient has failed non-surgical conservative treatments for greater than 12 weeks to include NSAID's and/or analgesics and activity modification.  Onset of symptoms was gradual starting 6+ month ago with gradually worsening course since that time.The patient noted prior procedures of the hip to include arthroplasty on the right hip per Dr. Charlann Boxerlin  In May 2015.  Patient currently rates pain in the left hip at 10 out of 10 with activity. Patient has night pain, worsening of pain with activity and weight bearing, trendelenberg gait, pain that interfers with activities of daily living and pain with passive range of motion. Patient has evidence of periarticular osteophytes and joint space narrowing by imaging studies. This condition presents safety issues increasing the risk of falls.  There is no current active infection.  Risks, benefits and expectations were discussed with the patient.  Risks including but not limited to the risk of anesthesia, blood clots, nerve damage, blood vessel damage, failure of the prosthesis, infection and up to and including death.  Patient understand the risks, benefits and expectations and wishes to proceed with surgery.   PCP: Pamelia HoitWILSON,FRED HENRY, MD  D/C Plans:      Home with HHPT  Post-op Meds:       No Rx given   Tranexamic Acid:      To be given - IV    Decadron:      Is to be given  FYI:     ASA post-op  Norco post-op   Previous procedure: Right THA, AA utilizing DePuy THR system  size 50mm pinnacle cup  size 32+4 neutral Altrex liner  size 1 Hi Tri Lock stem   32+1 delta ceramic ball     Patient Active Problem List   Diagnosis Date Noted  . Obese 10/01/2013  . Expected  blood loss anemia 10/01/2013  . 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 Hypertension   . Diverticulosis   . GERD (gastroesophageal reflux disease)   . H/O hiatal hernia   . Arthritis   . History of blood transfusion     07/2012 - several blood transfusions per patient     Past Surgical History  Procedure Laterality Date  . Colon surgery for devierticulosis     . Tubal ligation    . Total hip arthroplasty Right 09/30/2013    Procedure: RIGHT TOTAL HIP ARTHROPLASTY ANTERIOR APPROACH;  Surgeon: Shelda PalMatthew D Olin, MD;  Location: [facility];  Service: Orthopedics;  Laterality: Right;    No prescriptions prior to admission   No Known Allergies   History  Substance Use Topics  . Smoking status: Never Smoker   . Smokeless tobacco: Never Used  . Alcohol Use: No       Review of Systems  Constitutional: Negative.   HENT: Negative.   Eyes: Negative.   Respiratory: Negative.   Cardiovascular: Negative.   Gastrointestinal: Positive for heartburn.  Genitourinary: Negative.   Musculoskeletal: Positive for joint pain.  Skin: Negative.   Neurological: Negative.   Endo/Heme/Allergies: Negative.   Psychiatric/Behavioral: Negative.     Objective:  Physical Exam  Constitutional: She is oriented to person, place, and  time. She appears well-developed and well-nourished.  HENT:  Head: Normocephalic and atraumatic.  Eyes: Pupils are equal, round, and reactive to light.  Neck: Neck supple. No JVD present. No tracheal deviation present. No thyromegaly present.  Cardiovascular: Normal rate, regular rhythm, normal heart sounds and intact distal pulses.   Respiratory: Effort normal and breath sounds normal. No stridor. No respiratory distress. She has no wheezes.  GI: Soft. There is no tenderness. There is no guarding.  Musculoskeletal:       Left hip: She exhibits decreased range of motion, decreased strength, tenderness and  bony tenderness. She exhibits no swelling, no deformity and no laceration.  Lymphadenopathy:    She has no cervical adenopathy.  Neurological: She is alert and oriented to person, place, and time.  Skin: Skin is warm and dry.  Psychiatric: She has a normal mood and affect.       Labs:  Estimated body mass index is 31.58 kg/(m^2) as calculated from the following:   Height as of 01/31/14: 5' 0.25" (1.53 m).   Weight as of 01/31/14: 73.936 kg (163 lb).   Imaging Review Plain radiographs demonstrate severe degenerative joint disease of the left hip(s). The bone quality appears to be good for age and reported activity level.  Assessment/Plan:  End stage arthritis, left hip(s)  The patient history, physical examination, clinical judgement of the provider and imaging studies are consistent with end stage degenerative joint disease of the left hip(s) and total hip arthroplasty is deemed medically necessary. The treatment options including medical management, injection therapy, arthroscopy and arthroplasty were discussed at length. The risks and benefits of total hip arthroplasty were presented and reviewed. The risks due to aseptic loosening, infection, stiffness, dislocation/subluxation,  thromboembolic complications and other imponderables were discussed.  The patient acknowledged the explanation, agreed to proceed with the plan and consent was signed. Patient is being admitted for inpatient treatment for surgery, pain control, PT, OT, prophylactic antibiotics, VTE prophylaxis, progressive ambulation and ADL's and discharge planning.The patient is planning to be discharged home with home health services.      Anastasio AuerbachMatthew S. Mays Paino   PA-C  04/29/2014, 11:23 AM

## 2014-05-04 ENCOUNTER — Encounter (HOSPITAL_COMMUNITY)
Admission: RE | Admit: 2014-05-04 | Discharge: 2014-05-04 | Disposition: A | Discharge status: Home / Self Care | Payer: Medicare Other | Source: Ambulatory Visit | Attending: Orthopedic Surgery | Admitting: Orthopedic Surgery

## 2014-05-04 ENCOUNTER — Encounter (HOSPITAL_COMMUNITY): Payer: Self-pay

## 2014-05-04 DIAGNOSIS — Z01812 Encounter for preprocedural laboratory examination: Secondary | ICD-10-CM | POA: Diagnosis not present

## 2014-05-04 LAB — BASIC METABOLIC PANEL
ANION GAP: 10 (ref 5–15)
BUN: 17 mg/dL (ref 6–23)
CALCIUM: 9.7 mg/dL (ref 8.4–10.5)
CO2: 27 meq/L (ref 19–32)
CREATININE: 0.72 mg/dL (ref 0.50–1.10)
Chloride: 99 mEq/L (ref 96–112)
GFR calc Af Amer: >90 mL/min (ref >90)
GFR calc non Af Amer: 85 mL/min — ABNORMAL LOW (ref >90)
Glucose, Bld: 96 mg/dL (ref 70–99)
Potassium: 4.9 mEq/L (ref 3.7–5.3)
Sodium: 136 mEq/L — ABNORMAL LOW (ref 137–147)

## 2014-05-04 LAB — URINALYSIS, ROUTINE W REFLEX MICROSCOPIC
Bilirubin Urine: NEGATIVE
Glucose, UA: NEGATIVE mg/dL
Hgb urine dipstick: NEGATIVE
Ketones, ur: NEGATIVE mg/dL
LEUKOCYTES UA: NEGATIVE
NITRITE: NEGATIVE
PH: 7 (ref 5.0–8.0)
Protein, ur: NEGATIVE mg/dL
SPECIFIC GRAVITY, URINE: 1.01 (ref 1.005–1.030)
Urobilinogen, UA: 0.2 mg/dL (ref 0.0–1.0)

## 2014-05-04 LAB — CBC
HEMATOCRIT: 37.6 % (ref 36.0–46.0)
Hemoglobin: 12.3 g/dL (ref 12.0–15.0)
MCH: 30.4 pg (ref 26.0–34.0)
MCHC: 32.7 g/dL (ref 30.0–36.0)
MCV: 92.8 fL (ref 78.0–100.0)
Platelets: 235 10*3/uL (ref 150–400)
RBC: 4.05 MIL/uL (ref 3.87–5.11)
RDW: 13 % (ref 11.5–15.5)
WBC: 5.1 10*3/uL (ref 4.0–10.5)

## 2014-05-04 LAB — PROTIME-INR
INR: 0.93 (ref 0.00–1.49)
Prothrombin Time: 12.6 seconds (ref 11.6–15.2)

## 2014-05-04 LAB — APTT: APTT: 28 s (ref 24–37)

## 2014-05-04 LAB — SURGICAL PCR SCREEN
MRSA, PCR: NEGATIVE
STAPHYLOCOCCUS AUREUS: NEGATIVE

## 2014-05-04 NOTE — Pre-Procedure Instructions (Signed)
05-04-14 EKG/ CXR 5'15 Epic.

## 2014-05-04 NOTE — Patient Instructions (Addendum)
18 Rose Burgess  05/04/2014   Your procedure is scheduled on: 12-21  -2015 Monday  [facility] and [facility]. Arrive at    0515    AM.  Call this number if you have problems the morning of surgery: [phone]  Or Presurgical Testing [phone].   For Living Will and/or Health Care Power Attorney Forms: please provide copy for your medical record,may bring AM of surgery(Forms should be already notarized -we do not provide this service).(05-04-14  No information preferred today).      Do not eat food/ or drink: After Midnight.      Take these medicines the morning of surgery with A SIP OF WATER: Omeprazole. Bring Flovent diskus-use if needed.   Do not wear jewelry, make-up or nail polish.  Do not wear deodorant, lotions, powders, or perfumes.   Do not shave legs and under arms- 48 hours(2 days) prior to first CHG shower.(Shaving face and neck okay.)  Do not bring valuables to the hospital.(Hospital is not responsible for lost valuables).  Contacts, dentures or removable bridgework, body piercing, hair pins may not be worn into surgery.  Leave suitcase in the car. After surgery it may be brought to your room.  For patients admitted to the hospital, checkout time is 11:00 AM the day of discharge.(Restricted visitors-Any Persons displaying flu-like symptoms or illness).    Patients discharged the day of surgery will not be allowed to drive home. Must have responsible person with you x 24 hours once discharged.  Name and phone number of your driver: Normajean BaxterJack Tilmon, spouse [phone] h     Please read over the following fact sheets that you were given:  CHG(Chlorhexidine Gluconate 4% Surgical Soap) use, MRSA Information, Blood Transfusion fact sheet, Incentive Spirometry Instruction.  Remember : Type/Screen "Blue armbands" - may not be removed once applied(would result in being retested AM of surgery, if removed).         [facility] - Preparing for Surgery Before surgery, you can play an important role.  Because skin is not sterile, your skin needs to be as free of germs as possible.  You can reduce the number of germs on your skin by washing with CHG (chlorahexidine gluconate) soap before surgery.  CHG is an antiseptic cleaner which kills germs and bonds with the skin to continue killing germs even after washing. Please DO NOT use if you have an allergy to CHG or antibacterial soaps.  If your skin becomes reddened/irritated stop using the CHG and inform your nurse when you arrive at Short Stay. Do not shave (including legs and underarms) for at least 48 hours prior to the first CHG shower.  You may shave your face/neck. Please follow these instructions carefully:  1.  Shower with CHG Soap the night before surgery and the  morning of Surgery.  2.  If you choose to wash your hair, wash your hair first as usual with your  normal  shampoo.  3.  After you shampoo, rinse your hair and body thoroughly to remove the  shampoo.                           4.  Use CHG as you would any other liquid soap.  You can apply chg directly  to the skin and wash  Gently with a scrungie or clean washcloth.  5.  Apply the CHG Soap to your body ONLY FROM THE NECK DOWN.   Do not use on face/ open                           Wound or open sores. Avoid contact with eyes, ears mouth and genitals (private parts).                       Wash face,  Genitals (private parts) with your normal soap.             6.  Wash thoroughly, paying special attention to the area where your surgery  will be performed.  7.  Thoroughly rinse your body with warm water from the neck down.  8.  DO NOT shower/wash with your normal soap after using and rinsing off  the CHG Soap.                9.  Pat yourself dry with a clean towel.            10.  Wear clean pajamas.            11.  Place clean sheets on your bed the night of your first shower and do not   sleep with pets. Day of Surgery : Do not apply any lotions/deodorants the morning of surgery.  Please wear clean clothes to the hospital/surgery center.  FAILURE TO FOLLOW THESE INSTRUCTIONS MAY RESULT IN THE CANCELLATION OF YOUR SURGERY PATIENT SIGNATURE_________________________________  NURSE SIGNATURE__________________________________  ________________________________________________________________________   Adam Phenix  An incentive spirometer is a tool that can help keep your lungs clear and active. This tool measures how well you are filling your lungs with each breath. Taking long deep breaths may help reverse or decrease the chance of developing breathing (pulmonary) problems (especially infection) following:  A long period of time when you are unable to move or be active. BEFORE THE PROCEDURE   If the spirometer includes an indicator to show your best effort, your nurse or respiratory therapist will set it to a desired goal.  If possible, sit up straight or lean slightly forward. Try not to slouch.  Hold the incentive spirometer in an upright position. INSTRUCTIONS FOR USE   Sit on the edge of your bed if possible, or sit up as far as you can in bed or on a chair.  Hold the incentive spirometer in an upright position.  Breathe out normally.  Place the mouthpiece in your mouth and seal your lips tightly around it.  Breathe in slowly and as deeply as possible, raising the piston or the ball toward the top of the column.  Hold your breath for 3-5 seconds or for as long as possible. Allow the piston or ball to fall to the bottom of the column.  Remove the mouthpiece from your mouth and breathe out normally.  Rest for a few seconds and repeat Steps 1 through 7 at least 10 times every 1-2 hours when you are awake. Take your time and take a few normal breaths between deep breaths.  The spirometer may include an indicator to show your best effort. Use the  indicator as a goal to work toward during each repetition.  After each set of 10 deep breaths, practice coughing to be sure your lungs are clear. If you have an incision (the cut made at the time of  surgery), support your incision when coughing by placing a pillow or rolled up towels firmly against it. Once you are able to get out of bed, walk around indoors and cough well. You may stop using the incentive spirometer when instructed by your caregiver.  RISKS AND COMPLICATIONS  Take your time so you do not get dizzy or light-headed.  If you are in pain, you may need to take or ask for pain medication before doing incentive spirometry. It is harder to take a deep breath if you are having pain. AFTER USE  Rest and breathe slowly and easily.  It can be helpful to keep track of a log of your progress. Your caregiver can provide you with a simple table to help with this. If you are using the spirometer at home, follow these instructions: [place] IF:   You are having difficultly using the spirometer.  You have trouble using the spirometer as often as instructed.  Your pain medication is not giving enough relief while using the spirometer.  You develop fever of 100.5 F (38.1 C) or higher. SEEK IMMEDIATE MEDICAL CARE IF:   You cough up bloody sputum that had not been present before.  You develop fever of 102 F (38.9 C) or greater.  You develop worsening pain at or near the incision site. MAKE SURE YOU:   Understand these instructions.  Will watch your condition.  Will get help right away if you are not doing well or get worse. Document Released: 09/18/2006 Document Revised: 07/31/2011 Document Reviewed: 11/19/2006 [employer] Patient Information 2014 [employer], [place].   ________________________________________________________________________  WHAT IS A BLOOD TRANSFUSION? Blood Transfusion Information  A transfusion is the replacement of blood or some of its parts. Blood  is made up of multiple cells which provide different functions.  Red blood cells carry oxygen and are used for blood loss replacement.  White blood cells fight against infection.  Platelets control bleeding.  Plasma helps clot blood.  Other blood products are available for specialized needs, such as hemophilia or other clotting disorders. BEFORE THE TRANSFUSION  Who gives blood for transfusions?   Healthy volunteers who are fully evaluated to make sure their blood is safe. This is blood bank blood. Transfusion therapy is the safest it has ever been in the practice of medicine. Before blood is taken from a donor, a complete history is taken to make sure that person has no history of diseases nor engages in risky social behavior (examples are intravenous drug use or sexual activity with multiple partners). The donor's travel history is screened to minimize risk of transmitting infections, such as malaria. The donated blood is tested for signs of infectious diseases, such as HIV and hepatitis. The blood is then tested to be sure it is compatible with you in order to minimize the chance of a transfusion reaction. If you or a relative donates blood, this is often done in anticipation of surgery and is not appropriate for emergency situations. It takes many days to process the donated blood. RISKS AND COMPLICATIONS Although transfusion therapy is very safe and saves many lives, the main dangers of transfusion include:   Getting an infectious disease.  Developing a transfusion reaction. This is an allergic reaction to something in the blood you were given. Every precaution is taken to prevent this. The decision to have a blood transfusion has been considered carefully by your caregiver before blood is given. Blood is not given unless the benefits outweigh the risks. AFTER THE TRANSFUSION  Right after receiving a blood transfusion, you will usually feel much better and more energetic. This is  especially true if your red blood cells have gotten low (anemic). The transfusion raises the level of the red blood cells which carry oxygen, and this usually causes an energy increase.  The nurse administering the transfusion will monitor you carefully for complications. HOME CARE INSTRUCTIONS  No special instructions are needed after a transfusion. You may find your energy is better. Speak with your caregiver about any limitations on activity for underlying diseases you may have. SEEK MEDICAL CARE IF:   Your condition is not improving after your transfusion.  You develop redness or irritation at the intravenous (IV) site. SEEK IMMEDIATE MEDICAL CARE IF:  Any of the following symptoms occur over the next 12 hours:  Shaking chills.  You have a temperature by mouth above 102 F (38.9 C), not controlled by medicine.  Chest, back, or muscle pain.  People around you feel you are not acting correctly or are confused.  Shortness of breath or difficulty breathing.  Dizziness and fainting.  You get a rash or develop hives.  You have a decrease in urine output.  Your urine turns a dark color or changes to pink, red, or brown. Any of the following symptoms occur over the next 10 days:  You have a temperature by mouth above 102 F (38.9 C), not controlled by medicine.  Shortness of breath.  Weakness after normal activity.  The white part of the eye turns yellow (jaundice).  You have a decrease in the amount of urine or are urinating less often.  Your urine turns a dark color or changes to pink, red, or brown. Document Released: 05/05/2000 Document Revised: 07/31/2011 Document Reviewed: 12/23/2007 [facility] Patient Information 2014 [place], [place].  _______________________________________________________________________

## 2014-05-11 ENCOUNTER — Inpatient Hospital Stay (HOSPITAL_COMMUNITY): Payer: Medicare Other

## 2014-05-11 ENCOUNTER — Encounter (HOSPITAL_COMMUNITY)
Admission: RE | Disposition: A | Discharge status: Home / Self Care | Payer: Self-pay | Source: Ambulatory Visit | Attending: Orthopedic Surgery

## 2014-05-11 ENCOUNTER — Inpatient Hospital Stay (HOSPITAL_COMMUNITY)
Admission: RE | Admit: 2014-05-11 | Discharge: 2014-05-12 | DRG: 470 | Disposition: A | Discharge status: Home / Self Care | Payer: Medicare Other | Source: Ambulatory Visit | Attending: Orthopedic Surgery | Admitting: Orthopedic Surgery

## 2014-05-11 ENCOUNTER — Inpatient Hospital Stay (HOSPITAL_COMMUNITY): Payer: Medicare Other | Admitting: Anesthesiology

## 2014-05-11 ENCOUNTER — Encounter (HOSPITAL_COMMUNITY): Payer: Self-pay | Admitting: *Deleted

## 2014-05-11 DIAGNOSIS — K449 Diaphragmatic hernia without obstruction or gangrene: Secondary | ICD-10-CM | POA: Diagnosis present

## 2014-05-11 DIAGNOSIS — Z96641 Presence of right artificial hip joint: Secondary | ICD-10-CM | POA: Diagnosis present

## 2014-05-11 DIAGNOSIS — K219 Gastro-esophageal reflux disease without esophagitis: Secondary | ICD-10-CM | POA: Diagnosis present

## 2014-05-11 DIAGNOSIS — M25552 Pain in left hip: Secondary | ICD-10-CM | POA: Diagnosis present

## 2014-05-11 DIAGNOSIS — K5790 Diverticulosis of intestine, part unspecified, without perforation or abscess without bleeding: Secondary | ICD-10-CM | POA: Diagnosis present

## 2014-05-11 DIAGNOSIS — Z96649 Presence of unspecified artificial hip joint: Secondary | ICD-10-CM

## 2014-05-11 DIAGNOSIS — Z6831 Body mass index (BMI) 31.0-31.9, adult: Secondary | ICD-10-CM

## 2014-05-11 DIAGNOSIS — I10 Essential (primary) hypertension: Secondary | ICD-10-CM | POA: Diagnosis present

## 2014-05-11 DIAGNOSIS — M1612 Unilateral primary osteoarthritis, left hip: Principal | ICD-10-CM | POA: Diagnosis present

## 2014-05-11 DIAGNOSIS — E669 Obesity, unspecified: Secondary | ICD-10-CM | POA: Diagnosis present

## 2014-05-11 HISTORY — PX: TOTAL HIP ARTHROPLASTY: SHX124

## 2014-05-11 LAB — TYPE AND SCREEN
ABO/RH(D): O POS
Antibody Screen: NEGATIVE

## 2014-05-11 SURGERY — ARTHROPLASTY, HIP, TOTAL, ANTERIOR APPROACH
Anesthesia: Spinal | Site: Hip | Laterality: Left

## 2014-05-11 MED ORDER — TRANEXAMIC ACID 100 MG/ML IV SOLN
1000.0000 mg | Freq: Once | INTRAVENOUS | Status: AC
  Administered 2014-05-11: 1000 mg via INTRAVENOUS
  Filled 2014-05-11: qty 10

## 2014-05-11 MED ORDER — HYDROCODONE-ACETAMINOPHEN 7.5-325 MG PO TABS
1.0000 | ORAL_TABLET | ORAL | Status: DC
  Administered 2014-05-11 (×2): 2 via ORAL
  Administered 2014-05-11: 1 via ORAL
  Administered 2014-05-12: 2 via ORAL
  Administered 2014-05-12: 1 via ORAL
  Administered 2014-05-12: 2 via ORAL
  Administered 2014-05-12: 1 via ORAL
  Filled 2014-05-11 (×6): qty 2

## 2014-05-11 MED ORDER — LIDOCAINE HCL (CARDIAC) 20 MG/ML IV SOLN
INTRAVENOUS | Status: DC | PRN
  Administered 2014-05-11: 50 mg via INTRAVENOUS

## 2014-05-11 MED ORDER — LIP MEDEX EX OINT
TOPICAL_OINTMENT | CUTANEOUS | Status: AC
Start: 2014-05-11 — End: 2014-05-12
  Filled 2014-05-11: qty 7

## 2014-05-11 MED ORDER — PANTOPRAZOLE SODIUM 40 MG PO TBEC
40.0000 mg | DELAYED_RELEASE_TABLET | Freq: Every day | ORAL | Status: DC
  Administered 2014-05-11 – 2014-05-12 (×2): 40 mg via ORAL
  Filled 2014-05-11 (×3): qty 1

## 2014-05-11 MED ORDER — FERROUS SULFATE 325 (65 FE) MG PO TABS
325.0000 mg | ORAL_TABLET | Freq: Three times a day (TID) | ORAL | Status: DC
  Administered 2014-05-11 – 2014-05-12 (×2): 325 mg via ORAL
  Filled 2014-05-11 (×5): qty 1

## 2014-05-11 MED ORDER — SODIUM CHLORIDE 0.9 % IV SOLN
100.0000 mL/h | INTRAVENOUS | Status: DC
  Administered 2014-05-11 – 2014-05-12 (×3): 100 mL/h via INTRAVENOUS
  Filled 2014-05-11 (×8): qty 1000

## 2014-05-11 MED ORDER — HYDROMORPHONE HCL 1 MG/ML IJ SOLN
0.2500 mg | INTRAMUSCULAR | Status: DC | PRN
Start: 2014-05-11 — End: 2014-05-11

## 2014-05-11 MED ORDER — CHLORHEXIDINE GLUCONATE 4 % EX LIQD: 60.0000 mL | Freq: Once | CUTANEOUS | Status: DC

## 2014-05-11 MED ORDER — ALUM & MAG HYDROXIDE-SIMETH 200-200-20 MG/5ML PO SUSP: 30.0000 mL | ORAL | Status: DC | PRN

## 2014-05-11 MED ORDER — DIPHENHYDRAMINE HCL 25 MG PO CAPS: 25.0000 mg | ORAL_CAPSULE | Freq: Four times a day (QID) | ORAL | Status: DC | PRN

## 2014-05-11 MED ORDER — METOCLOPRAMIDE HCL 5 MG/ML IJ SOLN: 5.0000 mg | Freq: Three times a day (TID) | INTRAMUSCULAR | Status: DC | PRN

## 2014-05-11 MED ORDER — POLYETHYLENE GLYCOL 3350 17 G PO PACK
17.0000 g | PACK | Freq: Two times a day (BID) | ORAL | Status: DC
  Administered 2014-05-11 – 2014-05-12 (×2): 17 g via ORAL

## 2014-05-11 MED ORDER — MENTHOL 3 MG MT LOZG: 1.0000 | LOZENGE | OROMUCOSAL | Status: DC | PRN

## 2014-05-11 MED ORDER — 0.9 % SODIUM CHLORIDE (POUR BTL) OPTIME
TOPICAL | Status: DC | PRN
Start: 2014-05-11 — End: 2014-05-11
  Administered 2014-05-11: 1000 mL

## 2014-05-11 MED ORDER — FENTANYL CITRATE 0.05 MG/ML IJ SOLN
INTRAMUSCULAR | Status: DC | PRN
  Administered 2014-05-11: 100 ug via INTRAVENOUS

## 2014-05-11 MED ORDER — METOCLOPRAMIDE HCL 10 MG PO TABS: 5.0000 mg | ORAL_TABLET | Freq: Three times a day (TID) | ORAL | Status: DC | PRN

## 2014-05-11 MED ORDER — BUPIVACAINE HCL (PF) 0.5 % IJ SOLN
INTRAMUSCULAR | Status: DC | PRN
  Administered 2014-05-11: 3 mL

## 2014-05-11 MED ORDER — DEXAMETHASONE SODIUM PHOSPHATE 10 MG/ML IJ SOLN
10.0000 mg | Freq: Once | INTRAMUSCULAR | Status: AC
  Administered 2014-05-12: 10 mg via INTRAVENOUS
  Filled 2014-05-11: qty 1

## 2014-05-11 MED ORDER — HYDROMORPHONE HCL 1 MG/ML IJ SOLN
0.5000 mg | INTRAMUSCULAR | Status: DC | PRN
  Administered 2014-05-11: 0.5 mg via INTRAVENOUS
  Administered 2014-05-11: 1 mg via INTRAVENOUS
  Filled 2014-05-11 (×3): qty 1

## 2014-05-11 MED ORDER — DEXAMETHASONE SODIUM PHOSPHATE 10 MG/ML IJ SOLN
10.0000 mg | Freq: Once | INTRAMUSCULAR | Status: AC
  Administered 2014-05-11: 10 mg via INTRAVENOUS

## 2014-05-11 MED ORDER — CEFAZOLIN SODIUM-DEXTROSE 2-3 GM-% IV SOLR
INTRAVENOUS | Status: AC
  Filled 2014-05-11: qty 50

## 2014-05-11 MED ORDER — METHOCARBAMOL 500 MG PO TABS
500.0000 mg | ORAL_TABLET | Freq: Four times a day (QID) | ORAL | Status: DC | PRN
  Administered 2014-05-12: 500 mg via ORAL
  Filled 2014-05-11: qty 1

## 2014-05-11 MED ORDER — PHENOL 1.4 % MT LIQD
1.0000 | OROMUCOSAL | Status: DC | PRN
Start: 2014-05-11 — End: 2014-05-12

## 2014-05-11 MED ORDER — CEFAZOLIN SODIUM-DEXTROSE 2-3 GM-% IV SOLR
2.0000 g | INTRAVENOUS | Status: AC
  Administered 2014-05-11: 2 g via INTRAVENOUS

## 2014-05-11 MED ORDER — CELECOXIB 200 MG PO CAPS
200.0000 mg | ORAL_CAPSULE | Freq: Two times a day (BID) | ORAL | Status: DC
  Administered 2014-05-11 – 2014-05-12 (×2): 200 mg via ORAL
  Filled 2014-05-11 (×3): qty 1

## 2014-05-11 MED ORDER — LIDOCAINE HCL (CARDIAC) 20 MG/ML IV SOLN
INTRAVENOUS | Status: AC
  Filled 2014-05-11: qty 5

## 2014-05-11 MED ORDER — EPHEDRINE SULFATE 50 MG/ML IJ SOLN
INTRAMUSCULAR | Status: DC | PRN
  Administered 2014-05-11: 5 mg via INTRAVENOUS
  Administered 2014-05-11 (×2): 10 mg via INTRAVENOUS

## 2014-05-11 MED ORDER — ONDANSETRON HCL 4 MG/2ML IJ SOLN: 4.0000 mg | Freq: Four times a day (QID) | INTRAMUSCULAR | Status: DC | PRN

## 2014-05-11 MED ORDER — ONDANSETRON HCL 4 MG/2ML IJ SOLN
INTRAMUSCULAR | Status: DC | PRN
  Administered 2014-05-11: 4 mg via INTRAVENOUS

## 2014-05-11 MED ORDER — ONDANSETRON HCL 4 MG/2ML IJ SOLN
INTRAMUSCULAR | Status: AC
  Filled 2014-05-11: qty 2

## 2014-05-11 MED ORDER — DOCUSATE SODIUM 100 MG PO CAPS
100.0000 mg | ORAL_CAPSULE | Freq: Two times a day (BID) | ORAL | Status: DC
  Administered 2014-05-11 – 2014-05-12 (×2): 100 mg via ORAL

## 2014-05-11 MED ORDER — LACTATED RINGERS IV SOLN: INTRAVENOUS | Status: DC

## 2014-05-11 MED ORDER — FENTANYL CITRATE 0.05 MG/ML IJ SOLN
INTRAMUSCULAR | Status: AC
  Filled 2014-05-11: qty 2

## 2014-05-11 MED ORDER — ATORVASTATIN CALCIUM 10 MG PO TABS
10.0000 mg | ORAL_TABLET | Freq: Every evening | ORAL | Status: DC
  Administered 2014-05-11: 10 mg via ORAL
  Filled 2014-05-11 (×2): qty 1

## 2014-05-11 MED ORDER — PROPOFOL 10 MG/ML IV BOLUS
INTRAVENOUS | Status: AC
  Filled 2014-05-11: qty 20

## 2014-05-11 MED ORDER — LACTATED RINGERS IV SOLN
INTRAVENOUS | Status: DC | PRN
  Administered 2014-05-11 (×3): via INTRAVENOUS

## 2014-05-11 MED ORDER — PROMETHAZINE HCL 25 MG/ML IJ SOLN: 6.2500 mg | INTRAMUSCULAR | Status: DC | PRN

## 2014-05-11 MED ORDER — ASPIRIN EC 325 MG PO TBEC
325.0000 mg | DELAYED_RELEASE_TABLET | Freq: Two times a day (BID) | ORAL | Status: DC
  Administered 2014-05-12: 325 mg via ORAL
  Filled 2014-05-11 (×3): qty 1

## 2014-05-11 MED ORDER — CEFAZOLIN SODIUM-DEXTROSE 2-3 GM-% IV SOLR
2.0000 g | Freq: Four times a day (QID) | INTRAVENOUS | Status: AC
  Administered 2014-05-11 (×2): 2 g via INTRAVENOUS
  Filled 2014-05-11 (×3): qty 50

## 2014-05-11 MED ORDER — FLUTICASONE PROPIONATE HFA 44 MCG/ACT IN AERO: 1.0000 | INHALATION_SPRAY | Freq: Two times a day (BID) | RESPIRATORY_TRACT | Status: DC | PRN

## 2014-05-11 MED ORDER — MIDAZOLAM HCL 5 MG/5ML IJ SOLN
INTRAMUSCULAR | Status: DC | PRN
  Administered 2014-05-11: 2 mg via INTRAVENOUS

## 2014-05-11 MED ORDER — MIDAZOLAM HCL 2 MG/2ML IJ SOLN
INTRAMUSCULAR | Status: AC
  Filled 2014-05-11: qty 2

## 2014-05-11 MED ORDER — FLUTICASONE PROPIONATE (INHAL) 50 MCG/BLIST IN AEPB: 1.0000 | INHALATION_SPRAY | Freq: Two times a day (BID) | RESPIRATORY_TRACT | Status: DC | PRN

## 2014-05-11 MED ORDER — MAGNESIUM CITRATE PO SOLN
1.0000 | Freq: Once | ORAL | Status: AC | PRN
Start: 2014-05-11 — End: 2014-05-11

## 2014-05-11 MED ORDER — BUPIVACAINE HCL (PF) 0.5 % IJ SOLN
INTRAMUSCULAR | Status: AC
Start: 2014-05-11 — End: 2014-05-11
  Filled 2014-05-11: qty 30

## 2014-05-11 MED ORDER — ONDANSETRON HCL 4 MG PO TABS: 4.0000 mg | ORAL_TABLET | Freq: Four times a day (QID) | ORAL | Status: DC | PRN

## 2014-05-11 MED ORDER — HYDROCODONE-ACETAMINOPHEN 7.5-325 MG PO TABS
1.0000 | ORAL_TABLET | ORAL | Status: DC
  Filled 2014-05-11: qty 1

## 2014-05-11 MED ORDER — METHOCARBAMOL 1000 MG/10ML IJ SOLN
500.0000 mg | Freq: Four times a day (QID) | INTRAMUSCULAR | Status: DC | PRN
  Filled 2014-05-11: qty 5

## 2014-05-11 MED ORDER — BISACODYL 10 MG RE SUPP: 10.0000 mg | Freq: Every day | RECTAL | Status: DC | PRN

## 2014-05-11 MED ORDER — DEXAMETHASONE SODIUM PHOSPHATE 10 MG/ML IJ SOLN
INTRAMUSCULAR | Status: AC
  Filled 2014-05-11: qty 1

## 2014-05-11 MED ORDER — PROPOFOL INFUSION 10 MG/ML OPTIME
INTRAVENOUS | Status: DC | PRN
  Administered 2014-05-11: 100 ug/kg/min via INTRAVENOUS

## 2014-05-11 SURGICAL SUPPLY — 38 items
BAG ZIPLOCK 12X15 (MISCELLANEOUS) IMPLANT
CAPT HIP TOTAL 2 ×3 IMPLANT
COVER PERINEAL POST (MISCELLANEOUS) ×3 IMPLANT
DRAPE C-ARM 42X120 X-RAY (DRAPES) ×3 IMPLANT
DRAPE STERI IOBAN 125X83 (DRAPES) ×3 IMPLANT
DRAPE U-SHAPE 47X51 STRL (DRAPES) ×9 IMPLANT
DRSG AQUACEL AG ADV 3.5X10 (GAUZE/BANDAGES/DRESSINGS) ×3 IMPLANT
DURAPREP 26ML APPLICATOR (WOUND CARE) ×3 IMPLANT
ELECT BLADE TIP CTD 4 INCH (ELECTRODE) ×3 IMPLANT
ELECT REM PT RETURN 9FT ADLT (ELECTROSURGICAL) ×3
ELECTRODE REM PT RTRN 9FT ADLT (ELECTROSURGICAL) ×1 IMPLANT
FACESHIELD WRAPAROUND (MASK) ×12 IMPLANT
GLOVE BIO SURGEON STRL SZ7.5 (GLOVE) ×3 IMPLANT
GLOVE BIOGEL PI IND STRL 7.5 (GLOVE) ×4 IMPLANT
GLOVE BIOGEL PI IND STRL 8.5 (GLOVE) ×1 IMPLANT
GLOVE BIOGEL PI INDICATOR 7.5 (GLOVE) ×8
GLOVE BIOGEL PI INDICATOR 8.5 (GLOVE) ×2
GLOVE ECLIPSE 8.0 STRL XLNG CF (GLOVE) ×3 IMPLANT
GLOVE ORTHO TXT STRL SZ7.5 (GLOVE) ×6 IMPLANT
GLOVE SURG SS PI 7.5 STRL IVOR (GLOVE) ×3 IMPLANT
GOWN BRE IMP PREV XXLGXLNG (GOWN DISPOSABLE) ×3 IMPLANT
GOWN SPEC L3 XXLG W/TWL (GOWN DISPOSABLE) ×3 IMPLANT
GOWN STRL REUS W/TWL LRG LVL3 (GOWN DISPOSABLE) ×3 IMPLANT
GOWN STRL REUS W/TWL XL LVL3 (GOWN DISPOSABLE) ×3 IMPLANT
HOLDER FOLEY CATH W/STRAP (MISCELLANEOUS) ×3 IMPLANT
KIT BASIN OR (CUSTOM PROCEDURE TRAY) ×3 IMPLANT
LIQUID BAND (GAUZE/BANDAGES/DRESSINGS) ×3 IMPLANT
PACK TOTAL JOINT (CUSTOM PROCEDURE TRAY) ×3 IMPLANT
SAW OSC TIP CART 19.5X105X1.3 (SAW) ×3 IMPLANT
SUT MNCRL AB 4-0 PS2 18 (SUTURE) ×3 IMPLANT
SUT VIC AB 1 CT1 36 (SUTURE) ×9 IMPLANT
SUT VIC AB 2-0 CT1 27 (SUTURE) ×4
SUT VIC AB 2-0 CT1 TAPERPNT 27 (SUTURE) ×2 IMPLANT
SUT VLOC 180 0 24IN GS25 (SUTURE) ×3 IMPLANT
TOWEL OR 17X26 10 PK STRL BLUE (TOWEL DISPOSABLE) ×3 IMPLANT
TOWEL OR NON WOVEN STRL DISP B (DISPOSABLE) ×3 IMPLANT
TRAY FOLEY CATH 14FRSI W/METER (CATHETERS) ×3 IMPLANT
WATER STERILE IRR 1500ML POUR (IV SOLUTION) ×3 IMPLANT

## 2014-05-11 NOTE — Progress Notes (Signed)
Utilization review completed.  

## 2014-05-11 NOTE — Anesthesia Postprocedure Evaluation (Signed)
  Anesthesia Post-op Note  Patient: Rose Burgess  Procedure(s) Performed: Procedure(s) (LRB): LEFT TOTAL HIP ARTHROPLASTY ANTERIOR APPROACH (Left)  Patient Location: PACU  Anesthesia Type: Spinal  Level of Consciousness: awake and alert   Airway and Oxygen Therapy: Patient Spontanous Breathing  Post-op Pain: mild  Post-op Assessment: Post-op Vital signs reviewed, Patient's Cardiovascular Status Stable, Respiratory Function Stable, Patent Airway and No signs of Nausea or vomiting  Last Vitals:  Filed Vitals:   05/11/14 1206  BP: 129/67  Pulse: 74  Temp: 36.5 C  Resp: 16    Post-op Vital Signs: stable   Complications: No apparent anesthesia complications

## 2014-05-11 NOTE — Anesthesia Preprocedure Evaluation (Signed)
Anesthesia Evaluation  Patient identified by MRN, date of birth, ID band Patient awake    Reviewed: Allergy & Precautions, H&P , NPO status , Patient's Chart, lab work & pertinent test results  Airway Mallampati: II  TM Distance: >3 FB Neck ROM: Full    Dental no notable dental hx.    Pulmonary neg pulmonary ROS,  breath sounds clear to auscultation  Pulmonary exam normal       Cardiovascular hypertension, Pt. on medications Rhythm:Regular Rate:Normal     Neuro/Psych negative neurological ROS  negative psych ROS   GI/Hepatic Neg liver ROS, hiatal hernia, GERD-  Medicated,  Endo/Other  negative endocrine ROS  Renal/GU negative Renal ROS  negative genitourinary   Musculoskeletal  (+) Arthritis -,   Abdominal (+) + obese,   Peds negative pediatric ROS (+)  Hematology  (+) anemia ,   Anesthesia Other Findings   Reproductive/Obstetrics negative OB ROS                             Anesthesia Physical Anesthesia Plan  ASA: II  Anesthesia Plan: Spinal   Post-op Pain Management:    Induction: Intravenous  Airway Management Planned:   Additional Equipment:   Intra-op Plan:   Post-operative Plan: Extubation in OR  Informed Consent: I have reviewed the patients History and Physical, chart, labs and discussed the procedure including the risks, benefits and alternatives for the proposed anesthesia with the patient or authorized representative who has indicated his/her understanding and acceptance.   Dental advisory given  Plan Discussed with: CRNA  Anesthesia Plan Comments: (Discussed risks/benefits of spinal including headache, backache, failure, bleeding, infection, and nerve damage. Patient consents to spinal. Questions answered. Coagulation studies and platelet count acceptable.)        Anesthesia Quick Evaluation

## 2014-05-11 NOTE — Evaluation (Signed)
Physical Therapy Evaluation Patient Details Name: Rose Burgess MRN: [id number] DOB: 05/25/1945 Today's Date: 05/11/2014   History of Present Illness  68 y.o. female with h/o R THA -direct anterior  09/30/13 admitted for L THA -DA  Clinical Impression  *Pt is s/p THA resulting in the deficits listed below (see PT Problem List). ** Pt will benefit from skilled PT to increase their independence and safety with mobility to allow discharge to the venue listed below.   Pt ambulated 3565' with RW and performed L THA exercises with min A. Good progress expected.  **    Follow Up Recommendations Home health PT    Equipment Recommendations  None recommended by PT    Recommendations for Other Services       Precautions / Restrictions Precautions Precautions: None Restrictions Weight Bearing Restrictions: No      Mobility  Bed Mobility Overal bed mobility: Needs Assistance Bed Mobility: Supine to Sit     Supine to sit: Min assist     General bed mobility comments: min A LLE  Transfers Overall transfer level: Needs assistance Equipment used: Rolling walker (2 wheeled) Transfers: Sit to/from Stand Sit to Stand: Min guard         General transfer comment: cues hand placement  Ambulation/Gait Ambulation/Gait assistance: Min guard Ambulation Distance (Feet): 65 Feet Assistive device: Rolling walker (2 wheeled) Gait Pattern/deviations: Step-to pattern   Gait velocity interpretation: Below normal speed for age/gender General Gait Details: cues for sequencing  Stairs            Wheelchair Mobility    Modified Rankin (Stroke Patients Only)       Balance Overall balance assessment: Modified Independent                                           Pertinent Vitals/Pain Pain Assessment: 0-10 Pain Score: 3  Pain Location: L hip Pain Descriptors / Indicators: Tightness Pain Intervention(s): Limited activity within patient's tolerance;Monitored  during session;Premedicated before session;Ice applied    Home Living Family/patient expects to be discharged to:: Private residence Living Arrangements: Spouse/significant other Available Help at Discharge: Family;Available 24 hours/day Type of Home: House Home Access: Stairs to enter Entrance Stairs-Rails: None Entrance Stairs-Number of Steps: 1 Home Layout: Two level;Able to live on main level with bedroom/bathroom Home Equipment: Dan HumphreysWalker - 2 wheels;Bedside commode      Prior Function Level of Independence: Independent               Hand Dominance   Dominant Hand: Right    Extremity/Trunk Assessment   Upper Extremity Assessment: Overall WFL for tasks assessed           Lower Extremity Assessment: LLE deficits/detail   LLE Deficits / Details: L knee ext -4/5, hip AAROM WFL, ankle WNL  Cervical / Trunk Assessment: Normal  Communication   Communication: No difficulties  Cognition Arousal/Alertness: Awake/alert Behavior During Therapy: WFL for tasks assessed/performed Overall Cognitive Status: Within Functional Limits for tasks assessed                      General Comments      Exercises Total Joint Exercises Ankle Circles/Pumps: AROM;Both;10 reps;Supine Quad Sets: AROM;Both;10 reps;Supine Gluteal Sets: AROM;Both;5 reps;Supine Heel Slides: AAROM;Left;10 reps;Supine Hip ABduction/ADduction: AAROM;Left;10 reps;Supine Long Arc Quad: AROM;Left;5 reps;Seated      Assessment/Plan    PT Assessment  Patient needs continued PT services  PT Diagnosis Acute pain;Difficulty walking   PT Problem List Decreased activity tolerance;Decreased strength;Decreased mobility;Pain  PT Treatment Interventions DME instruction;Gait training;Stair training;Functional mobility training;Therapeutic activities;Patient/family education;Therapeutic exercise   PT Goals (Current goals can be found in the Care Plan section) Acute Rehab PT Goals Patient Stated Goal: to be able  to go for long walks at the park PT Goal Formulation: With patient Time For Goal Achievement: 05/25/14 Potential to Achieve Goals: Good    Frequency 7X/week   Barriers to discharge        Co-evaluation               End of Session Equipment Utilized During Treatment: Gait belt Activity Tolerance: Patient tolerated treatment well Patient left: in chair;with call bell/phone within reach Nurse Communication: Mobility status         Time: 1415-1445 PT Time Calculation (min) (ACUTE ONLY): 30 min   Charges:   PT Evaluation $Initial PT Evaluation Tier I: 1 Procedure PT Treatments $Gait Training: 8-22 mins $Therapeutic Exercise: 8-22 mins   PT G Codes:          Tamala SerUhlenberg, Cyntia Staley Kistler 05/11/2014, 2:53 PM [phone]

## 2014-05-11 NOTE — Op Note (Signed)
NAME:  Rose Burgess                ACCOUNT NO.: [id number]      MEDICAL RECORD NO.: [id number]      FACILITY:  [facility]      PHYSICIAN:  Durene Romans D  DATE OF BIRTH:  [birth year]     DATE OF PROCEDURE:  05/11/2014                                 OPERATIVE REPORT         PREOPERATIVE DIAGNOSIS: Left  hip osteoarthritis.      POSTOPERATIVE DIAGNOSIS:  Left hip osteoarthritis. History of right total hip replacement     PROCEDURE:  Left total hip replacement through an anterior approach   utilizing DePuy THR system, component size 50mm pinnacle cup, a size 32+4 neutral   Altrex liner, a size 1 Hi Tri Lock stem with a 32+1 delta ceramic   ball.      SURGEON:  Madlyn Frankel. Charlann Boxer, M.D.      ASSISTANT:  Lanney Gins, PA-C      ANESTHESIA:  Spinal.      SPECIMENS:  None.      COMPLICATIONS:  None.      BLOOD LOSS:  400 cc     DRAINS:  None.      INDICATION OF THE PROCEDURE:  Rose Burgess is a 68 y.o. female who had   presented to office for evaluation of left hip pain.  Radiographs revealed   progressive degenerative changes with bone-on-bone   articulation to the  hip joint.  The patient had painful limited range of   motion significantly affecting their overall quality of life.  The patient was failing to    respond to conservative measures, and at this point was ready   to proceed with more definitive measures.  The patient has noted progressive   degenerative changes in his hip, progressive problems and dysfunction   with regarding the hip prior to surgery.  Consent was obtained for   benefit of pain relief.  Specific risk of infection, DVT, component   failure, dislocation, need for revision surgery, as well discussion of   the anterior versus posterior approach were reviewed.  Consent was   obtained for benefit of anterior pain relief through an anterior   approach.      PROCEDURE IN DETAIL:  The patient was brought to operative theater.   Once adequate anesthesia, preoperative antibiotics, 2gm of Ancef and 1gm of Tranexamic Acid administered.   The patient was positioned supine on the OSI Hanna table.  Once adequate   padding of boney process was carried out, we had predraped out the hip, and  used fluoroscopy to confirm orientation of the pelvis and position.      The left hip was then prepped and draped from proximal iliac crest to   mid thigh with shower curtain technique.      Time-out was performed identifying the patient, planned procedure, and   extremity.     An incision was then made 2 cm distal and lateral to the   anterior superior iliac spine extending over the orientation of the   tensor fascia lata muscle and sharp dissection was carried down to the   fascia of the muscle and protractor placed in the soft tissues.      The fascia was  then incised.  The muscle belly was identified and swept   laterally and retractor placed along the superior neck.  Following   cauterization of the circumflex vessels and removing some pericapsular   fat, a second cobra retractor was placed on the inferior neck.  A third   retractor was placed on the anterior acetabulum after elevating the   anterior rectus.  A L-capsulotomy was along the line of the   superior neck to the trochanteric fossa, then extended proximally and   distally.  Tag sutures were placed and the retractors were then placed   intracapsular.  We then identified the trochanteric fossa and   orientation of my neck cut, confirmed this radiographically   and then made a neck osteotomy with the femur on traction.  The femoral   head was removed without difficulty or complication.  Traction was let   off and retractors were placed posterior and anterior around the   acetabulum.      The labrum and foveal tissue were debrided.  I began reaming with a 45mm   reamer and reamed up to 49mm reamer with good bony bed preparation and a 50mm   cup was chosen.  The final  50mm Pinnacle cup was then impacted under fluoroscopy  to confirm the depth of penetration and orientation with respect to   abduction.  A screw was placed followed by the hole eliminator.  The final   32+4 neutral Altrex liner was impacted with good visualized rim fit.  The cup was positioned anatomically within the acetabular portion of the pelvis.      At this point, the femur was rolled at 80 degrees.  Further capsule was   released off the inferior aspect of the femoral neck.  I then   released the superior capsule proximally.  The hook was placed laterally   along the femur and elevated manually and held in position with the bed   hook.  The leg was then extended and adducted with the leg rolled to 100   degrees of external rotation.  Once the proximal femur was fully   exposed, I used a box osteotome to set orientation.  I then began   broaching with the starting chili pepper broach and passed this by hand and then broached up to the size 1 broach same as other hip.  With the 1 broach in place I chose a high offset neck and did a trial reduction.  The offset was appropriate, leg lengths   appeared to be equal, confirmed radiographically.   Given these findings, I went ahead and dislocated the hip, repositioned all   retractors and positioned the right hip in the extended and abducted position.  The final 1 Hi Tri Lock stem was   chosen and it was impacted down to the level of neck cut.  Based on this   and the trial reduction, a 32+1 delta ceramic ball was chosen and   impacted onto a clean and dry trunnion, and the hip was reduced.  The   hip had been irrigated throughout the case again at this point.  I did   reapproximate the superior capsular leaflet to the anterior leaflet   using #1 Vicryl.  The fascia of the   tensor fascia lata muscle was then reapproximated using #1 Vicryl and #0 V-lock sutures.  The   remaining wound was closed with 2-0 Vicryl and running 4-0 Monocryl.   The  hip was cleaned, dried, and  dressed sterilely using Dermabond and   Aquacel dressing.  She was then brought   to recovery room in stable condition tolerating the procedure well.    Lanney GinsMatthew Babish, PA-C was present for the entirety of the case involved from   preoperative positioning, perioperative retractor management, general   facilitation of the case, as well as primary wound closure as assistant.            Madlyn FrankelMatthew D. Charlann Boxerlin, M.D.        05/11/2014 8:23 AM

## 2014-05-11 NOTE — Interval H&P Note (Signed)
History and Physical Interval Note:  05/11/2014 6:42 AM  Rose Burgess  has presented today for surgery, with the diagnosis of left hip osteoarthritis  The various methods of treatment have been discussed with the patient and family. After consideration of risks, benefits and other options for treatment, the patient has consented to  Procedure(s): LEFT TOTAL HIP ARTHROPLASTY ANTERIOR APPROACH (Left) as a surgical intervention .  The patient's history has been reviewed, patient examined, no change in status, stable for surgery.  I have reviewed the patient's chart and labs.  Questions were answered to the patient's satisfaction.     Shelda PalLIN,Azyria Osmon D

## 2014-05-11 NOTE — Transfer of Care (Signed)
Immediate Anesthesia Transfer of Care Note  Patient: Rose Burgess  Procedure(s) Performed: Procedure(s): LEFT TOTAL HIP ARTHROPLASTY ANTERIOR APPROACH (Left)  Patient Location: PACU  Anesthesia Type:Regional  Level of Consciousness: awake, alert  and oriented  Airway & Oxygen Therapy: Patient Spontanous Breathing and Patient connected to face mask oxygen  Post-op Assessment: Report given to PACU RN and Post -op Vital signs reviewed and stable  Post vital signs: Reviewed and stable  Complications: No apparent anesthesia complications

## 2014-05-11 NOTE — Anesthesia Procedure Notes (Signed)
Spinal Patient location during procedure: OR End time: 05/11/2014 7:16 AM Staffing Resident/CRNA: Noralyn Pick Performed by: anesthesiologist  Preanesthetic Checklist Completed: patient identified, site marked, surgical consent, pre-op evaluation, timeout performed, IV checked, risks and benefits discussed and monitors and equipment checked Spinal Block Patient position: sitting Prep: Betadine Patient monitoring: heart rate, continuous pulse ox and blood pressure Approach: midline Location: L2-3 Injection technique: single-shot Needle Needle type: Sprotte and Pencil-Tip  Needle gauge: 24 G Needle length: 9 cm Assessment Sensory level: T6 Additional Notes Expiration date of kit checked and confirmed. Patient tolerated procedure well, without complications.

## 2014-05-12 ENCOUNTER — Encounter (HOSPITAL_COMMUNITY): Payer: Self-pay | Admitting: Orthopedic Surgery

## 2014-05-12 LAB — CBC
HEMATOCRIT: 28.7 % — AB (ref 36.0–46.0)
Hemoglobin: 9.5 g/dL — ABNORMAL LOW (ref 12.0–15.0)
MCH: 30.4 pg (ref 26.0–34.0)
MCHC: 33.1 g/dL (ref 30.0–36.0)
MCV: 92 fL (ref 78.0–100.0)
Platelets: 194 10*3/uL (ref 150–400)
RBC: 3.12 MIL/uL — ABNORMAL LOW (ref 3.87–5.11)
RDW: 13.2 % (ref 11.5–15.5)
WBC: 9.1 10*3/uL (ref 4.0–10.5)

## 2014-05-12 LAB — BASIC METABOLIC PANEL
Anion gap: 5 (ref 5–15)
BUN: 12 mg/dL (ref 6–23)
CHLORIDE: 103 meq/L (ref 96–112)
CO2: 28 mmol/L (ref 19–32)
Calcium: 8.3 mg/dL — ABNORMAL LOW (ref 8.4–10.5)
Creatinine, Ser: 0.55 mg/dL (ref 0.50–1.10)
GFR calc Af Amer: >90 mL/min (ref >90)
GFR calc non Af Amer: >90 mL/min (ref >90)
GLUCOSE: 117 mg/dL — AB (ref 70–99)
POTASSIUM: 4.4 mmol/L (ref 3.5–5.1)
Sodium: 136 mmol/L (ref 135–145)

## 2014-05-12 MED ORDER — HYDROCODONE-ACETAMINOPHEN 7.5-325 MG PO TABS: 1.0000 | ORAL_TABLET | ORAL | Status: DC | PRN

## 2014-05-12 MED ORDER — TIZANIDINE HCL 4 MG PO CAPS: 4.0000 mg | ORAL_CAPSULE | Freq: Three times a day (TID) | ORAL | Status: DC | PRN

## 2014-05-12 MED ORDER — FERROUS SULFATE 325 (65 FE) MG PO TABS: 325.0000 mg | ORAL_TABLET | Freq: Three times a day (TID) | ORAL | Status: DC

## 2014-05-12 MED ORDER — POLYETHYLENE GLYCOL 3350 17 G PO PACK: 17.0000 g | PACK | Freq: Two times a day (BID) | ORAL | Status: DC

## 2014-05-12 MED ORDER — DSS 100 MG PO CAPS: 100.0000 mg | ORAL_CAPSULE | Freq: Two times a day (BID) | ORAL | Status: DC

## 2014-05-12 MED ORDER — ASPIRIN 325 MG PO TBEC: 325.0000 mg | DELAYED_RELEASE_TABLET | Freq: Two times a day (BID) | ORAL | Status: AC

## 2014-05-12 NOTE — Care Management Note (Signed)
    Page 1 of 2   05/12/2014     10:43:44 AM CARE MANAGEMENT NOTE 05/12/2014  Patient:  MAKAYLEIGH, POLIQUIN   Account Number:  [id number]  Date Initiated:  05/12/2014  Documentation initiated by:  [facility]  Subjective/Objective Assessment:   adm: LEFT TOTAL HIP ARTHROPLASTY ANTERIOR APPROACH (Left)     Action/Plan:   discharge plannning   Anticipated DC Date:  05/12/2014   Anticipated DC Plan:  [place]  CM consult      [facility] [place] Choice  HOME HEALTH   Choice offered to / List presented to:  C-1 Patient   DME arranged  NA      DME agency  NA     [place] arranged  HH-2 PT      [place]   Status of service:  Completed, signed off Medicare Important Message given?   (If response is "NO", the following Medicare IM given date fields will be blank) Date Medicare IM given:   Medicare IM given by:   Date Additional Medicare IM given:   Additional Medicare IM given by:    Discharge Disposition:  [place]  Per UR Regulation:    If discussed at Long Length of Stay Meetings, dates discussed:    Comments:  05/12/14 09:00 CM met with pt in room to offer choice of home health agency. Pt chooses [facility] to render HHPT. No DME is needed as pt had previous hip surgery and still has equipment.  Address and contact informaiton verified with pt.  Referral called to [employer], Tim.  No other CM needs were communicated.  Mariane Masters, BSN, CM [phone].

## 2014-05-12 NOTE — Progress Notes (Signed)
Physical Therapy Treatment Patient Details Name: Rose Burgess MRN: [id number] DOB: 05/12/1946 Today's Date: 05/12/2014    History of Present Illness 68 y.o. female with h/o R THA -direct anterior  09/30/13 admitted for L THA -DA    PT Comments    POD # 1 am session pt OOB in recliner.  Assisted with amb to BR then in hallway.  Assisted with performing THR TE's followed by ICE.  Pt progressing well and plans to D/C to home later today.  Follow Up Recommendations  Home health PT     Equipment Recommendations  None recommended by PT    Recommendations for Other Services       Precautions / Restrictions Precautions Precautions: None Restrictions Weight Bearing Restrictions: No    Mobility  Bed Mobility               General bed mobility comments: Pt OOB in recliner  Transfers Overall transfer level: Needs assistance Equipment used: Rolling walker (2 wheeled) Transfers: Sit to/from Stand Sit to Stand: Supervision;Min guard         General transfer comment: 25% cues hand placement and increased time  Ambulation/Gait Ambulation/Gait assistance: Supervision;Min guard Ambulation Distance (Feet): 85 Feet Assistive device: Rolling walker (2 wheeled) Gait Pattern/deviations: Step-to pattern;Step-through pattern;Decreased stride length Gait velocity: decreased   General Gait Details: cues for sequencing and safety with turns using RW   Stairs            Wheelchair Mobility    Modified Rankin (Stroke Patients Only)       Balance                                    Cognition                            Exercises   Total Hip Replacement TE's 10 reps ankle pumps 10 reps knee presses 10 reps heel slides 10 reps SAQ's 10 reps ABD Followed by ICE     General Comments        Pertinent Vitals/Pain Pain Assessment: 0-10 Pain Score: 5  Pain Location: L hip Pain Descriptors / Indicators: Constant;Sore;Tightness Pain  Intervention(s): Monitored during session;Premedicated before session;Repositioned;Ice applied    Home Living                      Prior Function            PT Goals (current goals can now be found in the care plan section) Progress towards PT goals: Progressing toward goals    Frequency  7X/week    PT Plan      Co-evaluation             End of Session Equipment Utilized During Treatment: Gait belt Activity Tolerance: Patient tolerated treatment well Patient left: in chair;with call bell/phone within reach     Time: 0935-1000 PT Time Calculation (min) (ACUTE ONLY): 25 min  Charges:  $Gait Training: 8-22 mins $Therapeutic Activity: 8-22 mins                    G Codes:      Felecia ShellingLori Twain Stenseth  PTA [facility]  Acute  Rehab Pager      [phone]

## 2014-05-12 NOTE — Progress Notes (Signed)
     Subjective: 1 Day Post-Op Procedure(s) (LRB): LEFT TOTAL HIP ARTHROPLASTY ANTERIOR APPROACH (Left)   Patient reports pain as mild, pain controlled. No events throughout the night. Ready to be discharged this afternoon after PT.  Objective:   VITALS:   Filed Vitals:   05/12/14 0151  BP: 128/61  Pulse: 72  Temp: 97.8 F (36.6 C)  Resp: 15    Dorsiflexion/Plantar flexion intact Incision: dressing C/D/I No cellulitis present Compartment soft  LABS  Recent Labs  05/12/14 0433  HGB 9.5*  HCT 28.7*  WBC 9.1  PLT 194     Recent Labs  05/12/14 0433  NA 136  K 4.4  BUN 12  CREATININE 0.55  GLUCOSE 117*     Assessment/Plan: 1 Day Post-Op Procedure(s) (LRB): LEFT TOTAL HIP ARTHROPLASTY ANTERIOR APPROACH (Left) Foley cath d/c'ed Advance diet Up with therapy D/C IV fluids Discharge home with home health  [facility]. Follow up with OLIN,Alveria Mcglaughlin D in 2 weeks.  Contact information:  [facility] [address], [address] [place] [id number] [phone]    Obese (BMI 30-39.9) Estimated body mass index is 31.38 kg/(m^2) as calculated from the following:   Height as of this encounter: 5\' 1"  (1.549 m).   Weight as of this encounter: 75.297 kg (166 lb). Patient also counseled that weight may inhibit the healing process Patient counseled that losing weight will help with future health issues         Anastasio AuerbachMatthew S. Jimesha Rising   PAC  05/12/2014, 9:28 AM

## 2014-05-12 NOTE — Progress Notes (Signed)
OT Cancellation Note  Patient Details Name: Rose Burgess MRN: [id number] DOB: 07/21/1945   Cancelled Treatment:    Reason Eval/Treat Not Completed: Other (comment)   Pt had L THA in 5/15.  No OT needs identified.  [name] 05/12/2014, 11:24 AM  [name], OTR/L [phone] 05/12/2014

## 2014-05-12 NOTE — Progress Notes (Signed)
Physical Therapy Treatment Patient Details Name: Lura EmBonnie B Tun MRN: [id number] DOB: 08/19/1945 Today's Date: 05/12/2014    History of Present Illness 68 y.o. female with h/o R THA -direct anterior  09/30/13 admitted for L THA -DA    PT Comments    POD # 1 pm session.  Assited pt to BR then amb in hallway.  Practiced going up/down one step forward with RW.  Performed THR TE's followed by ICE.  Pt ready for D/C to home.  Follow Up Recommendations  Home health PT     Equipment Recommendations  None recommended by PT    Recommendations for Other Services       Precautions / Restrictions Precautions Precautions: None Restrictions Weight Bearing Restrictions: No    Mobility  Bed Mobility               General bed mobility comments: Pt OOB in recliner  Transfers Overall transfer level: Needs assistance Equipment used: Rolling walker (2 wheeled) Transfers: Sit to/from Stand Sit to Stand: Supervision;Min guard         General transfer comment: 25% cues hand placement and increased time  Ambulation/Gait Ambulation/Gait assistance: Supervision;Min guard Ambulation Distance (Feet): 85 Feet Assistive device: Rolling walker (2 wheeled) Gait Pattern/deviations: Step-to pattern;Step-through pattern;Decreased stride length Gait velocity: decreased   General Gait Details: cues for sequencing and safety with turns using RW   Stairs Stairs: Yes Stairs assistance: Min guard Stair Management: No rails;Step to pattern;Forwards;With walker Number of Stairs: 1 General stair comments: with spouse present performed one step up/down forward with 25% VC's on proper tech and safety  Wheelchair Mobility    Modified Rankin (Stroke Patients Only)       Balance                                    Cognition                            Exercises   Total Hip Replacement TE's 10 reps ankle pumps 10 reps knee presses 10 reps heel slides 10 reps  SAQ's 10 reps ABD Followed by ICE     General Comments        Pertinent Vitals/Pain Pain Assessment: 0-10 Pain Score: 5  Pain Location: L hip Pain Descriptors / Indicators: Constant;Sore;Tightness Pain Intervention(s): Monitored during session;Premedicated before session;Repositioned;Ice applied    Home Living                      Prior Function            PT Goals (current goals can now be found in the care plan section) Progress towards PT goals: Progressing toward goals    Frequency  7X/week    PT Plan      Co-evaluation             End of Session Equipment Utilized During Treatment: Gait belt Activity Tolerance: Patient tolerated treatment well Patient left: in chair;with call bell/phone within reach     Time: 1340-1405 PT Time Calculation (min) (ACUTE ONLY): 25 min  Charges:  $Gait Training: 8-22 mins $Therapeutic Exercise: 8-22 mins                     G Codes:      Felecia ShellingLori Cassidee Deats  PTA [facility]  Acute  Rehab Pager  [phone]  

## 2014-05-12 NOTE — Progress Notes (Signed)
Discharge summary sent to payer through [facility]  

## 2014-05-12 NOTE — Progress Notes (Signed)
RN reviewed discharge instructions with patient and family. All questions answered.  Paperwork and prescriptions given.   NT rolled patient down in wheelchair to family car.  

## 2014-05-12 NOTE — Progress Notes (Signed)
[facility]  [facility] the [facility]: No DME needs  If patient discharges after hours, please call [phone].   Renard HamperLecretia Williamson 05/12/2014, 10:17 AM

## 2014-05-21 NOTE — Discharge Summary (Signed)
Physician Discharge Summary  Patient ID: Rose Burgess MRN: [id number] DOB/AGE: 70/09/1945 70 y.o.  Admit date: 05/11/2014 Discharge date: 05/12/2014   Procedures:  Procedure(s) (LRB): LEFT TOTAL HIP ARTHROPLASTY ANTERIOR APPROACH (Left)  Attending Physician:  Dr. Durene RomansMatthew Olin   Admission Diagnoses:   Left hip primary OA / pain  Discharge Diagnoses:  Principal Problem:   S/P left THA, AA Active Problems:   Obese  Past Medical History  Diagnosis Date  . Hypertension   . Diverticulosis   . GERD (gastroesophageal reflux disease)   . H/O hiatal hernia   . Arthritis   . History of blood transfusion     07/2012 - several blood transfusions per patient     HPI: Rose Burgess, 68 y.o. female, has a history of pain and functional disability in the left hip(s) due to arthritis and patient has failed non-surgical conservative treatments for greater than 12 weeks to include NSAID's and/or analgesics and activity modification. Onset of symptoms was gradual starting 6+ month ago with gradually worsening course since that time.The patient noted prior procedures of the hip to include arthroplasty on the right hip per Dr. Charlann Boxerlin In May 2015. Patient currently rates pain in the left hip at 10 out of 10 with activity. Patient has night pain, worsening of pain with activity and weight bearing, trendelenberg gait, pain that interfers with activities of daily living and pain with passive range of motion. Patient has evidence of periarticular osteophytes and joint space narrowing by imaging studies. This condition presents safety issues increasing the risk of falls. There is no current active infection. Risks, benefits and expectations were discussed with the patient. Risks including but not limited to the risk of anesthesia, blood clots, nerve damage, blood vessel damage, failure of the prosthesis, infection and up to and including death. Patient understand the risks, benefits and expectations  and wishes to proceed with surgery.   PCP: Pamelia HoitWILSON,FRED HENRY, MD   Discharged Condition: good  Hospital Course:  Patient underwent the above stated procedure on 05/11/2014. Patient tolerated the procedure well and brought to the recovery room in good condition and subsequently to the floor.  POD #1 BP: 128/61 ; Pulse: 72 ; Temp: 97.8 F (36.6 C) ; Resp: 15 Patient reports pain as mild, pain controlled. No events throughout the night. Ready to be discharged home. Dorsiflexion/plantar flexion intact, incision: dressing C/D/I, no cellulitis present and compartment soft.   LABS  Basename    HGB  9.5  HCT  28.7    Discharge Exam: General appearance: alert, cooperative and no distress Extremities: Homans sign is negative, no sign of DVT, no edema, redness or tenderness in the calves or thighs and no ulcers, gangrene or trophic changes  Disposition: Home with follow up in 2 weeks   Follow-up Information    Follow up with Shelda PalLIN,Indiyah Paone D, MD. Schedule an appointment as soon as possible for a visit in 2 weeks.   Specialty:  Orthopedic Surgery   Contact information:   [address] [place] [address] [phone]       [facility]   Why:  home health physical therapy   Contact information:   [address] [place] [address] [phone]       Discharge Instructions    Call MD / Call 911    Complete by:  As directed   If you experience chest pain or shortness of breath, CALL 911 and be transported to  the hospital emergency room.  If you develope a fever above 101 F, pus (white drainage) or increased drainage or redness at the wound, or calf pain, call your surgeon's office.     Change dressing    Complete by:  As directed   Maintain surgical dressing until follow up in the clinic. If the edges start to pull up, may reinforce with tape. If the dressing is no longer working, may remove and cover with gauze and tape, but  must keep the area dry and clean.  Call with any questions or concerns.     Constipation Prevention    Complete by:  As directed   Drink plenty of fluids.  Prune juice may be helpful.  You may use a stool softener, such as Colace (over the counter) 100 mg twice a day.  Use MiraLax (over the counter) for constipation as needed.     Diet - low sodium heart healthy    Complete by:  As directed      Discharge instructions    Complete by:  As directed   Maintain surgical dressing until follow up in the clinic. If the edges start to pull up, may reinforce with tape. If the dressing is no longer working, may remove and cover with gauze and tape, but must keep the area dry and clean.  Follow up in 2 weeks at [facility]. Call with any questions or concerns.     Increase activity slowly as tolerated    Complete by:  As directed      TED hose    Complete by:  As directed   Use stockings (TED hose) for 2 weeks on both leg(s).  You may remove them at night for sleeping.     Weight bearing as tolerated    Complete by:  As directed   Laterality:  left  Extremity:  Lower             Medication List    STOP taking these medications        diclofenac 50 MG tablet  Commonly known as:  CATAFLAM      TAKE these medications        aspirin 325 MG EC tablet  Take 1 tablet (325 mg total) by mouth 2 (two) times daily.     atorvastatin 10 MG tablet  Commonly known as:  LIPITOR  Take 10 mg by mouth every evening.     BIOTIN 5000 PO  Take 1 capsule by mouth every morning.     CALCIUM 600 + D PO  Take 1 tablet by mouth 2 (two) times daily.     DSS 100 MG Caps  Take 100 mg by mouth 2 (two) times daily.     ferrous sulfate 325 (65 FE) MG tablet  Take 1 tablet (325 mg total) by mouth 3 (three) times daily after meals.     fluticasone 50 MCG/BLIST diskus inhaler  Commonly known as:  FLOVENT DISKUS  Inhale 1 puff into the lungs 2 (two) times daily as needed (shortness of  breath--allergies). As needed per patient     glucosamine-chondroitin 500-400 MG tablet  Take 2 tablets by mouth every morning.     HYDROcodone-acetaminophen 7.5-325 MG per tablet  Commonly known as:  NORCO  Take 1-2 tablets by mouth every 4 (four) hours as needed for moderate pain.     lisinopril-hydrochlorothiazide 20-25 MG per tablet  Commonly known as:  PRINZIDE,ZESTORETIC  Take 1 tablet by mouth every morning.  multivitamin with minerals Tabs tablet  Take 1 tablet by mouth every morning.     Omega 3 1200 MG Caps  Take 2 capsules by mouth every morning.     omeprazole 20 MG capsule  Commonly known as:  PRILOSEC  Take 20 mg by mouth every morning.     polyethylene glycol packet  Commonly known as:  MIRALAX / GLYCOLAX  Take 17 g by mouth 2 (two) times daily.     tiZANidine 4 MG capsule  Commonly known as:  ZANAFLEX  Take 1 capsule (4 mg total) by mouth 3 (three) times daily as needed for muscle spasms.     vitamin B-12 500 MCG tablet  Commonly known as:  CYANOCOBALAMIN  Take 500 mcg by mouth every morning.         Signed: Anastasio AuerbachMatthew S. Keiko Myricks   PA-C  05/21/2014, 12:13 PM

## 2015-10-16 ENCOUNTER — Encounter (HOSPITAL_COMMUNITY): Payer: Self-pay | Admitting: Emergency Medicine

## 2015-10-16 ENCOUNTER — Emergency Department (HOSPITAL_COMMUNITY): Payer: Medicare Other

## 2015-10-16 ENCOUNTER — Observation Stay (HOSPITAL_COMMUNITY)
Admission: EM | Admit: 2015-10-16 | Discharge: 2015-10-18 | Disposition: A | Discharge status: Home / Self Care | Payer: Medicare Other | Attending: Internal Medicine | Admitting: Internal Medicine

## 2015-10-16 DIAGNOSIS — R42 Dizziness and giddiness: Secondary | ICD-10-CM | POA: Insufficient documentation

## 2015-10-16 DIAGNOSIS — E871 Hypo-osmolality and hyponatremia: Secondary | ICD-10-CM | POA: Diagnosis not present

## 2015-10-16 DIAGNOSIS — G8929 Other chronic pain: Secondary | ICD-10-CM | POA: Insufficient documentation

## 2015-10-16 DIAGNOSIS — T502X5A Adverse effect of carbonic-anhydrase inhibitors, benzothiadiazides and other diuretics, initial encounter: Secondary | ICD-10-CM | POA: Insufficient documentation

## 2015-10-16 DIAGNOSIS — R631 Polydipsia: Secondary | ICD-10-CM | POA: Insufficient documentation

## 2015-10-16 DIAGNOSIS — K219 Gastro-esophageal reflux disease without esophagitis: Secondary | ICD-10-CM | POA: Diagnosis not present

## 2015-10-16 DIAGNOSIS — Z96643 Presence of artificial hip joint, bilateral: Secondary | ICD-10-CM | POA: Diagnosis not present

## 2015-10-16 DIAGNOSIS — M1990 Unspecified osteoarthritis, unspecified site: Secondary | ICD-10-CM | POA: Diagnosis not present

## 2015-10-16 DIAGNOSIS — I10 Essential (primary) hypertension: Secondary | ICD-10-CM | POA: Diagnosis not present

## 2015-10-16 DIAGNOSIS — Z79899 Other long term (current) drug therapy: Secondary | ICD-10-CM | POA: Diagnosis not present

## 2015-10-16 DIAGNOSIS — I160 Hypertensive urgency: Secondary | ICD-10-CM | POA: Diagnosis not present

## 2015-10-16 LAB — I-STAT TROPONIN, ED: Troponin i, poc: 0 ng/mL (ref 0.00–0.08)

## 2015-10-16 LAB — CBC WITH DIFFERENTIAL/PLATELET
BASOS PCT: 0 %
Basophils Absolute: 0 10*3/uL (ref 0.0–0.1)
EOS ABS: 0.4 10*3/uL (ref 0.0–0.7)
Eosinophils Relative: 5 %
HCT: 35.8 % — ABNORMAL LOW (ref 36.0–46.0)
Hemoglobin: 13.2 g/dL (ref 12.0–15.0)
LYMPHS ABS: 1.8 10*3/uL (ref 0.7–4.0)
Lymphocytes Relative: 22 %
MCH: 31.4 pg (ref 26.0–34.0)
MCHC: 36.9 g/dL — ABNORMAL HIGH (ref 30.0–36.0)
MCV: 85.2 fL (ref 78.0–100.0)
Monocytes Absolute: 0.8 10*3/uL (ref 0.1–1.0)
Monocytes Relative: 10 %
Neutro Abs: 5.2 10*3/uL (ref 1.7–7.7)
Neutrophils Relative %: 63 %
Platelets: 224 10*3/uL (ref 150–400)
RBC: 4.2 MIL/uL (ref 3.87–5.11)
RDW: 12.4 % (ref 11.5–15.5)
WBC: 8.2 10*3/uL (ref 4.0–10.5)

## 2015-10-16 LAB — I-STAT CHEM 8, ED
BUN: 16 mg/dL (ref 6–20)
Calcium, Ion: 1.04 mmol/L — ABNORMAL LOW (ref 1.13–1.30)
Chloride: 82 mmol/L — ABNORMAL LOW (ref 101–111)
Creatinine, Ser: 0.7 mg/dL (ref 0.44–1.00)
Glucose, Bld: 107 mg/dL — ABNORMAL HIGH (ref 65–99)
HEMATOCRIT: 40 % (ref 36.0–46.0)
Hemoglobin: 13.6 g/dL (ref 12.0–15.0)
POTASSIUM: 3.5 mmol/L (ref 3.5–5.1)
SODIUM: 122 mmol/L — AB (ref 135–145)
TCO2: 26 mmol/L (ref 0–100)

## 2015-10-16 MED ORDER — SODIUM CHLORIDE 0.9 % IV BOLUS (SEPSIS)
1000.0000 mL | Freq: Once | INTRAVENOUS | Status: AC
  Administered 2015-10-17: 1000 mL via INTRAVENOUS

## 2015-10-16 NOTE — ED Provider Notes (Signed)
CSN: [id number]     Arrival date & time 10/16/15  2221 History     By signing my name below, I, Rose Burgess. Rose Burgess, attest that this documentation has been prepared under the direction and in the presence of Rose Edmondson, MD.  Electronically Signed: Suzan Burgess. Rose Burgess, ED Scribe. 10/16/2015. 11:50 PM.   No chief complaint on file.  Patient is a 70 y.o. female presenting with dizziness. The history is provided by the patient. No language interpreter was used.  Dizziness Quality:  Unable to specify Severity:  Moderate Onset quality:  Gradual Duration:  1 day Timing:  Intermittent Progression:  Unchanged Context: not with bowel movement   Relieved by:  Nothing Ineffective treatments:  None tried Associated symptoms: no chest pain, no headaches, no nausea, no shortness of breath, no vomiting and no weakness   Risk factors: hx of vertigo    HPI Comments: Rose Burgess is a 70 y.o. female with a PMHx of vertigo and HTN who presents to the Emergency Department complaining of intermittent, unchanged dizziness x 1 day. Pt states "i just didn't feel right" and unable to described dizziness. She also reports nausea and feeling "jittery". Pt states symptoms came on after ambulating earlier this afternoon. Currently pt is taking Naproxen every 12 hours s/p dental work. She has also recently started taking Tumeric. No recent fever, chills, vomiting, otalgia, chest pain or shortness of breath.  PCP: Rose Hoit, MD    Past Medical History  Diagnosis Date  . Hypertension   . Diverticulosis   . GERD (gastroesophageal reflux disease)   . H/O hiatal hernia   . Arthritis   . History of blood transfusion     07/2012 - several blood transfusions per patient    Past Surgical History  Procedure Laterality Date  . Colon surgery for devierticulosis       3'14 [place], Clarissa  . Tubal ligation    . Total hip arthroplasty Right 09/30/2013    Procedure: RIGHT TOTAL HIP ARTHROPLASTY ANTERIOR APPROACH;   Surgeon: Shelda Pal, MD;  Location: [facility]S;  Service: Orthopedics;  Laterality: Right;  . Total hip arthroplasty Left 05/11/2014    Procedure: LEFT TOTAL HIP ARTHROPLASTY ANTERIOR APPROACH;  Surgeon: Shelda Pal, MD;  Location: [facility];  Service: Orthopedics;  Laterality: Left;   No family history on file. Social History  Substance Use Topics  . Smoking status: Never Smoker   . Smokeless tobacco: Never Used  . Alcohol Use: No   OB History    No data available     Review of Systems  Constitutional: Negative for fever and chills.  Eyes: Negative for photophobia and visual disturbance.  Respiratory: Negative for cough and shortness of breath.   Cardiovascular: Negative for chest pain.  Gastrointestinal: Negative for nausea, vomiting and abdominal pain.  Neurological: Positive for dizziness. Negative for facial asymmetry, weakness, numbness and headaches.  Psychiatric/Behavioral: Negative for confusion.  All other systems reviewed and are negative.     Allergies  Review of patient's allergies indicates no known allergies.  Home Medications   Prior to Admission medications   Medication Sig Start Date End Date Taking? Authorizing Provider  atorvastatin (LIPITOR) 10 MG tablet Take 10 mg by mouth every evening.    Historical Provider, MD  BIOTIN 5000 PO Take 1 capsule by mouth every morning.     Historical Provider, MD  Calcium Carb-Cholecalciferol (CALCIUM 600 + D PO) Take 1 tablet by mouth 2 (two) times daily.  Historical Provider, MD  docusate sodium 100 MG CAPS Take 100 mg by mouth 2 (two) times daily. 05/12/14   Lanney Gins, PA-C  ferrous sulfate 325 (65 FE) MG tablet Take 1 tablet (325 mg total) by mouth 3 (three) times daily after meals. 05/12/14   Lanney Gins, PA-C  fluticasone (FLOVENT DISKUS) 50 MCG/BLIST diskus inhaler Inhale 1 puff into the lungs 2 (two) times daily as needed (shortness of breath--allergies). As needed per patient    Historical Provider, MD   glucosamine-chondroitin 500-400 MG tablet Take 2 tablets by mouth every morning.     Historical Provider, MD  HYDROcodone-acetaminophen (NORCO) 7.5-325 MG per tablet Take 1-2 tablets by mouth every 4 (four) hours as needed for moderate pain. 05/12/14   Lanney Gins, PA-C  lisinopril-hydrochlorothiazide (PRINZIDE,ZESTORETIC) 20-25 MG per tablet Take 1 tablet by mouth every morning.    Historical Provider, MD  Multiple Vitamin (MULTIVITAMIN WITH MINERALS) TABS tablet Take 1 tablet by mouth every morning.     Historical Provider, MD  Omega 3 1200 MG CAPS Take 2 capsules by mouth every morning.     Historical Provider, MD  omeprazole (PRILOSEC) 20 MG capsule Take 20 mg by mouth every morning.     Historical Provider, MD  polyethylene glycol (MIRALAX / GLYCOLAX) packet Take 17 g by mouth 2 (two) times daily. 05/12/14   Lanney Gins, PA-C  tiZANidine (ZANAFLEX) 4 MG capsule Take 1 capsule (4 mg total) by mouth 3 (three) times daily as needed for muscle spasms. 05/12/14   Lanney Gins, PA-C  vitamin B-12 (CYANOCOBALAMIN) 500 MCG tablet Take 500 mcg by mouth every morning.     Historical Provider, MD   Triage Vitals: BP 218/89 mmHg  Pulse 79  Temp(Src) 98.1 F (36.7 C) (Oral)  Resp 18  SpO2 98%   Physical Exam  Constitutional: She is oriented to person, place, and time. She appears well-developed and well-nourished. No distress.  HENT:  Head: Normocephalic and atraumatic.  Mouth/Throat: Oropharynx is clear and moist. No oropharyngeal exudate.  Eyes: EOM are normal. Pupils are equal, round, and reactive to light.  Neck: Normal range of motion.  Cardiovascular: Normal rate, regular rhythm, normal heart sounds and intact distal pulses.   Pulmonary/Chest: Effort normal and breath sounds normal. No respiratory distress. She has no wheezes. She has no rales.  Abdominal: Soft. She exhibits no distension. There is no tenderness. There is no rebound and no guarding.  Hyperactive bowel sounds   Musculoskeletal: Normal range of motion. She exhibits no edema or tenderness.  Neurological: She is alert and oriented to person, place, and time. She has normal reflexes. She displays normal reflexes. No cranial nerve deficit.  Cranial nerves 2-12 intact   Skin: Skin is warm and dry. She is not diaphoretic.  Psychiatric: She has a normal mood and affect. Judgment normal.  Nursing note and vitals reviewed.   ED Course  Procedures (including critical care time)  DIAGNOSTIC STUDIES: Oxygen Saturation is 98% on RA, Normal by my interpretation.    COORDINATION OF CARE: 11:49 PM- Will order imaging, blood work, and EKG. Discussed treatment plan with pt at bedside and pt agreed to plan.     Labs Review Labs Reviewed  CBC WITH DIFFERENTIAL/PLATELET  I-STAT CHEM 8, ED  I-STAT TROPOININ, ED    Imaging Review No results found. I have personally reviewed and evaluated these images and lab results as part of my medical decision-making.   EKG Interpretation None      MDM  Final diagnoses:  None     EKG Interpretation  Date/Time:  Saturday Oct 16 2015 22:53:00 EDT Ventricular Rate:  73 PR Interval:  161 QRS Duration: 93 QT Interval:  394 QTC Calculation: 434 R Axis:   49 Text Interpretation:  Sinus rhythm Confirmed by [facility]  MD, Tyronn Golda ([id number]) on 10/17/2015 12:04:58 AM      Results for orders placed or performed during the hospital encounter of 10/16/15  CBC with Differential/Platelet  Result Value Ref Range   WBC 8.2 4.0 - 10.5 K/uL   RBC 4.20 3.87 - 5.11 MIL/uL   Hemoglobin 13.2 12.0 - 15.0 g/dL   HCT 21.3 (L) 08.6 - 57.8 %   MCV 85.2 78.0 - 100.0 fL   MCH 31.4 26.0 - 34.0 pg   MCHC 36.9 (H) 30.0 - 36.0 g/dL   RDW 46.9 62.9 - 52.8 %   Platelets 224 150 - 400 K/uL   Neutrophils Relative % 63 %   Neutro Abs 5.2 1.7 - 7.7 K/uL   Lymphocytes Relative 22 %   Lymphs Abs 1.8 0.7 - 4.0 K/uL   Monocytes Relative 10 %   Monocytes Absolute 0.8 0.1 - 1.0 K/uL    Eosinophils Relative 5 %   Eosinophils Absolute 0.4 0.0 - 0.7 K/uL   Basophils Relative 0 %   Basophils Absolute 0.0 0.0 - 0.1 K/uL  I-Stat Chem 8, ED  Result Value Ref Range   Sodium 122 (L) 135 - 145 mmol/L   Potassium 3.5 3.5 - 5.1 mmol/L   Chloride 82 (L) 101 - 111 mmol/L   BUN 16 6 - 20 mg/dL   Creatinine, Ser 4.13 0.44 - 1.00 mg/dL   Glucose, Bld 244 (H) 65 - 99 mg/dL   Calcium, Ion 0.10 (L) 1.13 - 1.30 mmol/L   TCO2 26 0 - 100 mmol/L   Hemoglobin 13.6 12.0 - 15.0 g/dL   HCT 27.2 53.6 - 64.4 %  I-stat troponin, ED  Result Value Ref Range   Troponin i, poc 0.00 0.00 - 0.08 ng/mL   Comment 3           Dg Chest 2 View  10/17/2015  CLINICAL DATA:  Dizzy. Hypertension. Patient reports recent oral surgery. EXAM: CHEST  2 VIEW COMPARISON:  09/22/2013 FINDINGS: Lower lung volumes from prior exam leading to bronchovascular crowding. Normal heart size and mediastinal contours. No evidence of pulmonary edema. No focal airspace opacity, pleural effusion or pneumothorax. Osseous structures are unchanged. IMPRESSION: No acute process. Electronically Signed   By: Rubye Oaks M.D.   On: 10/17/2015 00:18   Ct Head Wo Contrast  10/17/2015  CLINICAL DATA:  Acute onset of dizziness and high blood pressure. Initial encounter. EXAM: CT HEAD WITHOUT CONTRAST TECHNIQUE: Contiguous axial images were obtained from the base of the skull through the vertex without intravenous contrast. COMPARISON:  None. FINDINGS: There is no evidence of acute infarction, mass lesion, or intra- or extra-axial hemorrhage on CT. The posterior fossa, including the cerebellum, brainstem and fourth ventricle, is within normal limits. The third and lateral ventricles, and basal ganglia are unremarkable in appearance. The cerebral hemispheres are symmetric in appearance, with normal gray-white differentiation. No mass effect or midline shift is seen. There is no evidence of fracture; visualized osseous structures are unremarkable  in appearance. The visualized portions of the orbits are within normal limits. The paranasal sinuses and mastoid air cells are well-aerated. No significant soft tissue abnormalities are seen. IMPRESSION: Unremarkable noncontrast CT of the head. Electronically  Signed   By: Roanna RaiderJeffery  Chang M.D.   On: 10/17/2015 00:08    Medications  sodium chloride 0.9 % bolus 1,000 mL (1,000 mLs Intravenous New Bag/Given 10/17/15 0000)   Correction 542 cc/hr  I personally performed the services described in this documentation, which was scribed in my presence. The recorded information has been reviewed and is accurate.     Cy BlamerApril Asiana Benninger, MD 10/17/15 [phone]

## 2015-10-16 NOTE — ED Notes (Signed)
Patient presents for dizziness, hypertension x1 day. History of vertigo. Denies N/V, decreased sensation, gait abnormality, weakness, HA, drift, facial droop. Patient A&O x4.

## 2015-10-16 NOTE — ED Notes (Signed)
RN starting IV, drawing labs 

## 2015-10-17 ENCOUNTER — Encounter (HOSPITAL_COMMUNITY): Payer: Self-pay | Admitting: Family Medicine

## 2015-10-17 DIAGNOSIS — G8929 Other chronic pain: Secondary | ICD-10-CM | POA: Diagnosis not present

## 2015-10-17 DIAGNOSIS — I160 Hypertensive urgency: Secondary | ICD-10-CM | POA: Diagnosis present

## 2015-10-17 DIAGNOSIS — E871 Hypo-osmolality and hyponatremia: Secondary | ICD-10-CM | POA: Diagnosis present

## 2015-10-17 DIAGNOSIS — I10 Essential (primary) hypertension: Secondary | ICD-10-CM | POA: Diagnosis not present

## 2015-10-17 DIAGNOSIS — K219 Gastro-esophageal reflux disease without esophagitis: Secondary | ICD-10-CM | POA: Diagnosis present

## 2015-10-17 DIAGNOSIS — R42 Dizziness and giddiness: Secondary | ICD-10-CM

## 2015-10-17 HISTORY — DX: Essential (primary) hypertension: I10

## 2015-10-17 LAB — BASIC METABOLIC PANEL
ANION GAP: 7 (ref 5–15)
Anion gap: 9 (ref 5–15)
Anion gap: 7 (ref 5–15)
Anion gap: 5 (ref 5–15)
BUN: 13 mg/dL (ref 6–20)
BUN: 11 mg/dL (ref 6–20)
BUN: 9 mg/dL (ref 6–20)
BUN: 10 mg/dL (ref 6–20)
CALCIUM: 8.5 mg/dL — AB (ref 8.9–10.3)
CALCIUM: 9.1 mg/dL (ref 8.9–10.3)
CALCIUM: 8.7 mg/dL — AB (ref 8.9–10.3)
CALCIUM: 9.1 mg/dL (ref 8.9–10.3)
CO2: 24 mmol/L (ref 22–32)
CO2: 28 mmol/L (ref 22–32)
CO2: 25 mmol/L (ref 22–32)
CO2: 29 mmol/L (ref 22–32)
CREATININE: 0.52 mg/dL (ref 0.44–1.00)
CREATININE: 0.63 mg/dL (ref 0.44–1.00)
CREATININE: 0.56 mg/dL (ref 0.44–1.00)
CREATININE: 0.59 mg/dL (ref 0.44–1.00)
Chloride: 91 mmol/L — ABNORMAL LOW (ref 101–111)
Chloride: 94 mmol/L — ABNORMAL LOW (ref 101–111)
Chloride: 96 mmol/L — ABNORMAL LOW (ref 101–111)
Chloride: 100 mmol/L — ABNORMAL LOW (ref 101–111)
GFR calc Af Amer: >60 mL/min (ref >60)
GFR calc Af Amer: >60 mL/min (ref >60)
GFR calc Af Amer: >60 mL/min (ref >60)
GFR calc non Af Amer: >60 mL/min (ref >60)
GLUCOSE: 110 mg/dL — AB (ref 65–99)
GLUCOSE: 98 mg/dL (ref 65–99)
GLUCOSE: 110 mg/dL — AB (ref 65–99)
Glucose, Bld: 101 mg/dL — ABNORMAL HIGH (ref 65–99)
Potassium: 3.3 mmol/L — ABNORMAL LOW (ref 3.5–5.1)
Potassium: 3.5 mmol/L (ref 3.5–5.1)
Potassium: 3.7 mmol/L (ref 3.5–5.1)
Potassium: 4.1 mmol/L (ref 3.5–5.1)
Sodium: 124 mmol/L — ABNORMAL LOW (ref 135–145)
Sodium: 129 mmol/L — ABNORMAL LOW (ref 135–145)
Sodium: 128 mmol/L — ABNORMAL LOW (ref 135–145)
Sodium: 134 mmol/L — ABNORMAL LOW (ref 135–145)

## 2015-10-17 LAB — URINALYSIS, ROUTINE W REFLEX MICROSCOPIC
Bilirubin Urine: NEGATIVE
Glucose, UA: NEGATIVE mg/dL
Ketones, ur: NEGATIVE mg/dL
Nitrite: NEGATIVE
Protein, ur: NEGATIVE mg/dL
SPECIFIC GRAVITY, URINE: 1.007 (ref 1.005–1.030)
pH: 7 (ref 5.0–8.0)

## 2015-10-17 LAB — URINE MICROSCOPIC-ADD ON: BACTERIA UA: NONE SEEN

## 2015-10-17 LAB — GLUCOSE, CAPILLARY: GLUCOSE-CAPILLARY: 88 mg/dL (ref 65–99)

## 2015-10-17 LAB — OSMOLALITY, URINE: OSMOLALITY UR: 148 mosm/kg — AB (ref 300–900)

## 2015-10-17 LAB — NA AND K (SODIUM & POTASSIUM), RAND UR
Potassium Urine: 6 mmol/L
Sodium, Ur: 58 mmol/L

## 2015-10-17 LAB — TSH: TSH: 3.634 u[IU]/mL (ref 0.350–4.500)

## 2015-10-17 LAB — CREATININE, URINE, RANDOM

## 2015-10-17 LAB — MAGNESIUM: Magnesium: 1.3 mg/dL — ABNORMAL LOW (ref 1.7–2.4)

## 2015-10-17 LAB — OSMOLALITY: OSMOLALITY: 257 mosm/kg — AB (ref 275–295)

## 2015-10-17 MED ORDER — HYDROMORPHONE HCL 1 MG/ML IJ SOLN: 0.5000 mg | INTRAMUSCULAR | Status: DC | PRN

## 2015-10-17 MED ORDER — POTASSIUM CHLORIDE IN NACL 40-0.9 MEQ/L-% IV SOLN
INTRAVENOUS | Status: DC
  Administered 2015-10-17: 125 mL/h via INTRAVENOUS
  Filled 2015-10-17 (×2): qty 1000

## 2015-10-17 MED ORDER — ACETAMINOPHEN 325 MG PO TABS: 650.0000 mg | ORAL_TABLET | Freq: Four times a day (QID) | ORAL | Status: DC | PRN

## 2015-10-17 MED ORDER — SODIUM CHLORIDE 0.9 % IV SOLN: 250.0000 mL | INTRAVENOUS | Status: DC | PRN

## 2015-10-17 MED ORDER — SODIUM CHLORIDE 0.9% FLUSH
3.0000 mL | Freq: Two times a day (BID) | INTRAVENOUS | Status: DC
  Administered 2015-10-17: 3 mL via INTRAVENOUS

## 2015-10-17 MED ORDER — HYDRALAZINE HCL 20 MG/ML IJ SOLN: 10.0000 mg | INTRAMUSCULAR | Status: DC | PRN

## 2015-10-17 MED ORDER — MAGNESIUM SULFATE 2 GM/50ML IV SOLN
2.0000 g | Freq: Once | INTRAVENOUS | Status: AC
  Administered 2015-10-17: 2 g via INTRAVENOUS
  Filled 2015-10-17: qty 50

## 2015-10-17 MED ORDER — ENOXAPARIN SODIUM 40 MG/0.4ML ~~LOC~~ SOLN
40.0000 mg | SUBCUTANEOUS | Status: DC
  Administered 2015-10-17 – 2015-10-18 (×2): 40 mg via SUBCUTANEOUS
  Filled 2015-10-17 (×2): qty 0.4

## 2015-10-17 MED ORDER — SODIUM CHLORIDE 0.9% FLUSH
3.0000 mL | INTRAVENOUS | Status: DC | PRN
Start: 2015-10-17 — End: 2015-10-18

## 2015-10-17 MED ORDER — POLYETHYLENE GLYCOL 3350 17 G PO PACK: 17.0000 g | PACK | Freq: Every day | ORAL | Status: DC | PRN

## 2015-10-17 MED ORDER — HYDROCODONE-ACETAMINOPHEN 5-325 MG PO TABS: 1.0000 | ORAL_TABLET | ORAL | Status: DC | PRN

## 2015-10-17 MED ORDER — FLUTICASONE PROPIONATE 50 MCG/ACT NA SUSP
2.0000 | Freq: Every day | NASAL | Status: DC
  Administered 2015-10-18: 2 via NASAL
  Filled 2015-10-17: qty 16

## 2015-10-17 MED ORDER — SODIUM CHLORIDE 0.9% FLUSH
3.0000 mL | Freq: Two times a day (BID) | INTRAVENOUS | Status: DC
  Administered 2015-10-17 (×2): 3 mL via INTRAVENOUS

## 2015-10-17 MED ORDER — LISINOPRIL 20 MG PO TABS
20.0000 mg | ORAL_TABLET | Freq: Every day | ORAL | Status: DC
  Administered 2015-10-17 – 2015-10-18 (×2): 20 mg via ORAL
  Filled 2015-10-17 (×2): qty 1

## 2015-10-17 MED ORDER — CALCIUM CARBONATE-VITAMIN D 500-200 MG-UNIT PO TABS
1.0000 | ORAL_TABLET | Freq: Every day | ORAL | Status: DC
  Administered 2015-10-17 – 2015-10-18 (×2): 1 via ORAL
  Filled 2015-10-17 (×2): qty 1

## 2015-10-17 MED ORDER — ADULT MULTIVITAMIN W/MINERALS CH
1.0000 | ORAL_TABLET | Freq: Every morning | ORAL | Status: DC
  Administered 2015-10-17 – 2015-10-18 (×2): 1 via ORAL
  Filled 2015-10-17 (×2): qty 1

## 2015-10-17 MED ORDER — ONDANSETRON HCL 4 MG PO TABS
4.0000 mg | ORAL_TABLET | Freq: Four times a day (QID) | ORAL | Status: DC | PRN
Start: 2015-10-17 — End: 2015-10-18

## 2015-10-17 MED ORDER — CALCIUM GLUCONATE 10 % IV SOLN
1.0000 g | Freq: Once | INTRAVENOUS | Status: AC
  Administered 2015-10-17: 1 g via INTRAVENOUS
  Filled 2015-10-17: qty 10

## 2015-10-17 MED ORDER — ACETAMINOPHEN 650 MG RE SUPP: 650.0000 mg | Freq: Four times a day (QID) | RECTAL | Status: DC | PRN

## 2015-10-17 MED ORDER — ONDANSETRON HCL 4 MG/2ML IJ SOLN: 4.0000 mg | Freq: Four times a day (QID) | INTRAMUSCULAR | Status: DC | PRN

## 2015-10-17 MED ORDER — OMEGA-3-ACID ETHYL ESTERS 1 G PO CAPS
2.0000 g | ORAL_CAPSULE | Freq: Every morning | ORAL | Status: DC
  Administered 2015-10-17 – 2015-10-18 (×2): 2 g via ORAL
  Filled 2015-10-17 (×2): qty 2

## 2015-10-17 MED ORDER — BISACODYL 5 MG PO TBEC: 5.0000 mg | DELAYED_RELEASE_TABLET | Freq: Every day | ORAL | Status: DC | PRN

## 2015-10-17 MED ORDER — CYANOCOBALAMIN 500 MCG PO TABS
500.0000 ug | ORAL_TABLET | Freq: Every morning | ORAL | Status: DC
  Administered 2015-10-17 – 2015-10-18 (×2): 500 ug via ORAL
  Filled 2015-10-17 (×2): qty 1

## 2015-10-17 NOTE — Progress Notes (Signed)
PROGRESS NOTE    Rose Burgess  NWG:[id number] DOB: 12/09/1945 DOA: 10/16/2015 PCP: Pamelia HoitWILSON,FRED HENRY, MD    Brief Narrative: 70yo woman with HTN and decreased PO intake but increased free water intake since recent dental procedure, who was admitted for symptomatic hyponatremia.  Of note, she has been on HCTZ for approximately 20 years.   Assessment & Plan:   Principal Problem:   Hyponatremia Active Problems:   Hypertensive urgency   Hypertension   GERD (gastroesophageal reflux disease)   Dizziness   Chronic pain  1. Hyponatremia, multifactorial --Reduce rate of NS infusion now to avoid correcting too rapidly in the first 24 hours --1500cc fluid restriction --Continue to hold HCTZ; would plan to avoid, at least temporarily at discharge  2. Hypertension with hypertensive urgency  BP improved, holding HCTZ, lisinopril continued.  Monitor for now.  May need to add low dose amlodipine.  3. Chronic pain  - Attributed to OA and stable  - Pain-control prn   4. GERD - Stable; no EGD on file  - Managed with prn Zantac at home  - Continue current management with prn H2-blocker   DVT prophylaxis: sq Lovenox  Code Status: Full  Family Communication: Patient alone during rounds this AM  Disposition Plan: Anticipate discharge tomorrow Consults called: None  Admission status: Observation   Consultants:   NONE  Procedures:  NONE  Antimicrobials:   NONE   Subjective: Dizziness resolved but still complaining of excessive thirst  Objective: Filed Vitals:   10/17/15 0030 10/17/15 0100 10/17/15 0125 10/17/15 0518  BP: 150/93 146/68 140/66 151/51  Pulse: 68 68 72 61  Temp:   98.4 F (36.9 C) 97.7 F (36.5 C)  TempSrc:   Oral Oral  Resp:   18 16  Height:   5' (1.524 m)   Weight:   72.6 kg (160 lb 0.9 oz)   SpO2: 97% 97% 98% 99%    Intake/Output Summary (Last 24 hours) at 10/17/15 1157 Last data filed at 10/17/15 0738  Gross per 24 hour  Intake 1703.33  ml  Output   1550 ml  Net 153.33 ml   Filed Weights   10/17/15 0125  Weight: 72.6 kg (160 lb 0.9 oz)    Examination:  General exam: Appears calm and comfortable  Respiratory system: Clear to auscultation. Respiratory effort normal. Cardiovascular system: NR/RR, no edema Gastrointestinal system: Abdomen is soft, nontender Central nervous system: Alert and oriented. No focal neurological deficits. Psychiatry: Judgement and insight appear normal. Mood & affect appropriate.     Data Reviewed: I have personally reviewed following labs and imaging studies  CBC:  Recent Labs Lab 10/16/15 2321 10/16/15 2328  WBC 8.2  --   NEUTROABS 5.2  --   HGB 13.2 13.6  HCT 35.8* 40.0  MCV 85.2  --   PLT 224  --    Basic Metabolic Panel:  Recent Labs Lab 10/16/15 2328 10/17/15 0100 10/17/15 0520 10/17/15 0910  NA 122* 124* 129* 128*  K 3.5 3.3* 3.5 3.7  CL 82* 91* 94* 96*  CO2  --  24 28 25   GLUCOSE 107* 110* 98 110*  BUN 16 13 11 9   CREATININE 0.70 0.52 0.63 0.56  CALCIUM  --  8.5* 9.1 8.7*  MG  --  1.3*  --   --    GFR: Estimated Creatinine Clearance: 57.3 mL/min (by C-G formula based on Cr of 0.56). CBG:  Recent Labs Lab 10/17/15 0741  GLUCAP 88   Thyroid Function Tests:  Recent Labs  10/17/15 0100  TSH 3.634    Radiology Studies: Dg Chest 2 View  10/17/2015  CLINICAL DATA:  Dizzy. Hypertension. Patient reports recent oral surgery. EXAM: CHEST  2 VIEW COMPARISON:  09/22/2013 FINDINGS: Lower lung volumes from prior exam leading to bronchovascular crowding. Normal heart size and mediastinal contours. No evidence of pulmonary edema. No focal airspace opacity, pleural effusion or pneumothorax. Osseous structures are unchanged. IMPRESSION: No acute process. Electronically Signed   By: Rubye Oaks M.D.   On: 10/17/2015 00:18   Ct Head Wo Contrast  10/17/2015  CLINICAL DATA:  Acute onset of dizziness and high blood pressure. Initial encounter. EXAM: CT HEAD  WITHOUT CONTRAST TECHNIQUE: Contiguous axial images were obtained from the base of the skull through the vertex without intravenous contrast. COMPARISON:  None. FINDINGS: There is no evidence of acute infarction, mass lesion, or intra- or extra-axial hemorrhage on CT. The posterior fossa, including the cerebellum, brainstem and fourth ventricle, is within normal limits. The third and lateral ventricles, and basal ganglia are unremarkable in appearance. The cerebral hemispheres are symmetric in appearance, with normal gray-white differentiation. No mass effect or midline shift is seen. There is no evidence of fracture; visualized osseous structures are unremarkable in appearance. The visualized portions of the orbits are within normal limits. The paranasal sinuses and mastoid air cells are well-aerated. No significant soft tissue abnormalities are seen. IMPRESSION: Unremarkable noncontrast CT of the head. Electronically Signed   By: Roanna Raider M.D.   On: 10/17/2015 00:08        Scheduled Meds: . calcium-vitamin D  1 tablet Oral Daily  . cyanocobalamin  500 mcg Oral q morning - 10a  . enoxaparin (LOVENOX) injection  40 mg Subcutaneous Q24H  . fluticasone  2 spray Each Nare Daily  . lisinopril  20 mg Oral Daily  . multivitamin with minerals  1 tablet Oral q morning - 10a  . omega-3 acid ethyl esters  2 g Oral q morning - 10a  . sodium chloride flush  3 mL Intravenous Q12H  . sodium chloride flush  3 mL Intravenous Q12H   Continuous Infusions: . 0.9 % NaCl with KCl 40 mEq / L 75 mL/hr (10/17/15 0723)        Time spent: 20 minutes    Jerene Bears, MD [facility]  If 7PM-7AM, please contact night-coverage [email or web address] Password [email or web address] 10/17/2015, 11:57 AM

## 2015-10-17 NOTE — H&P (Signed)
History and Physical    Rose Burgess:[id number] DOB: [birth year] DOA: 10/16/2015  PCP: Pamelia Hoit, MD   Patient coming from: Home   Chief Complaint: Dizziness, nausea, malaise  HPI: Rose Rose Burgess is a 70 y.o. female with medical history significant for hypertension, chronic pain, and GERD who presents to the ED with 1 day of dizziness, nausea, and malaise. Patient had been in her usual state of health and had an oral surgery with bone grafting on 10/14/2015. She tolerated the procedure well, but due to the associated dental pain, has been unable to tolerate much food at all. She reports developing some dizziness, nausea, and malaise the following day as well as dry mouth for which she has been drinking a lot of water. She reports drinking 6-8 bottles of water daily for the past 2 days and eating only a few bites of mashed potatoes and yogurt. Her symptoms worsened today, and became accompanied by a mild headache. She denies fever, chills, chest pain, or palpitations. There is no cough or dyspnea, and she denies abdominal pain, vomiting, or diarrhea. There has been no change in vision or hearing, focal numbness or weakness, or loss of coordination. She has never experienced similar symptoms previously.  ED Course: Upon arrival to the ED, patient is found to be afebrile, saturating well on room air, with normal heart rate, and hypertensive to 218/89. EKG features a normal sinus rhythm and troponin is undetectable. CBC is unremarkable and BMP is notable for sodium of 122 and chloride 82 with normal renal function and normal potassium. Head CT was obtained and negative for acute intracranial abnormality, and chest x-ray is negative for acute cardiopulmonary disease. Patient's blood pressure improved spontaneously and she was given a 1 L normal saline bolus. She has remained hemodynamically stable in the emergency department and will be admitted to the hospital for ongoing evaluation and  management of dizziness, malaise, nausea, and mild headache suspected secondary to severe hyponatremia.  Review of Systems:  All other systems reviewed and apart from HPI, are negative.  Past Medical History  Diagnosis Date  . Hypertension   . Diverticulosis   . GERD (gastroesophageal reflux disease)   . H/O hiatal hernia   . Arthritis   . History of blood transfusion     07/2012 - several blood transfusions per patient     Past Surgical History  Procedure Laterality Date  . Colon surgery for devierticulosis       3'14 [place]  . Tubal ligation    . 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 Total hip arthroplasty Left 05/11/2014    Procedure: LEFT TOTAL HIP ARTHROPLASTY ANTERIOR APPROACH;  Surgeon: Shelda Pal, MD;  Location: [facility];  Service: Orthopedics;  Laterality: Left;     reports that she has never smoked. She has never used smokeless tobacco. She reports that she does not drink alcohol or use illicit drugs.  No Known Allergies  History reviewed. No pertinent family history.   Prior to Admission medications   Medication Sig Start Date End Date Taking? Authorizing Provider  BIOTIN 5000 PO Take 1 capsule by mouth every morning.    Yes Historical Provider, MD  Calcium Carb-Cholecalciferol (CALCIUM 600 + D PO) Take 1 tablet by mouth 2 (two) times daily.   Yes Historical Provider, MD  fluticasone Aleda Grana)  50 MCG/ACT nasal spray Place 2 sprays into both nostrils daily. 10/10/15  Yes Historical Provider, MD  lisinopril-hydrochlorothiazide (PRINZIDE,ZESTORETIC) 20-25 MG per tablet Take 1 tablet by mouth every morning.   Yes Historical Provider, MD  Multiple Vitamin (MULTIVITAMIN WITH MINERALS) TABS tablet Take 1 tablet by mouth every morning.    Yes Historical Provider, MD  naproxen sodium (ANAPROX) 550 MG tablet Take 1  tablet by mouth as needed. pain 10/14/15  Yes Historical Provider, MD  Omega 3 1200 MG CAPS Take 2 capsules by mouth every morning.    Yes Historical Provider, MD  ranitidine (ZANTAC) 150 MG tablet Take 150 mg by mouth as needed for heartburn.   Yes Historical Provider, MD  vitamin B-12 (CYANOCOBALAMIN) 500 MCG tablet Take 500 mcg by mouth every morning.    Yes Historical Provider, MD  docusate sodium 100 MG CAPS Take 100 mg by mouth 2 (two) times daily. 05/12/14   Lanney Gins, PA-C  ferrous sulfate 325 (65 FE) MG tablet Take 1 tablet (325 mg total) by mouth 3 (three) times daily after meals. 05/12/14   Lanney Gins, PA-C  HYDROcodone-acetaminophen (NORCO) 7.5-325 MG per tablet Take 1-2 tablets by mouth every 4 (four) hours as needed for moderate pain. 05/12/14   Lanney Gins, PA-C  polyethylene glycol (MIRALAX / GLYCOLAX) packet Take 17 g by mouth 2 (two) times daily. 05/12/14   Lanney Gins, PA-C  tiZANidine (ZANAFLEX) 4 MG capsule Take 1 capsule (4 mg total) by mouth 3 (three) times daily as needed for muscle spasms. 05/12/14   Lanney Gins, PA-C    Physical Exam: Filed Vitals:   10/16/15 2355 10/17/15 0000 10/17/15 0015 10/17/15 0030  BP: 165/80 166/74  150/93  Pulse: 74 72 76 68  Temp:      TempSrc:      Resp: 18 20 16    SpO2: 99% 100% 97% 97%      Constitutional: NAD, calm, comfortable Eyes: PERTLA, lids and conjunctivae normal ENMT: Mucous membranes are dry. Faint ecchymosis overlies left mandible.   Neck: normal, supple, no masses, no thyromegaly Respiratory: clear to auscultation bilaterally, no wheezing, no crackles. Normal respiratory effort.   Cardiovascular: S1 & S2 heard, regular rate and rhythm, no significant murmurs / rubs / gallops.     Abdomen: No distension, no tenderness, no masses palpated. Bowel sounds normal.  Musculoskeletal: no clubbing / cyanosis. No joint deformity upper and lower extremities. Normal muscle tone.  Skin: no significant rashes,  lesions, ulcers. Warm, dry, well-perfused. Neurologic: CN 2-12 grossly intact. Sensation intact, DTR normal. Strength 5/5 in all 4 limbs.  Psychiatric: Normal judgment and insight. Alert and oriented x 3. Normal mood and affect.     Labs on Admission: I have personally reviewed following labs and imaging studies  CBC:  Recent Labs Lab 10/16/15 2321 10/16/15 2328  WBC 8.2  --   NEUTROABS 5.2  --   HGB 13.2 13.6  HCT 35.8* 40.0  MCV 85.2  --   PLT 224  --    Basic Metabolic Panel:  Recent Labs Lab 10/16/15 2328  NA 122*  K 3.5  CL 82*  GLUCOSE 107*  BUN 16  CREATININE 0.70   GFR: CrCl cannot be calculated (Unknown ideal weight.). Liver Function Tests: No results for input(s): AST, ALT, ALKPHOS, BILITOT, PROT, ALBUMIN in the last 168 hours. No results for input(s): LIPASE, AMYLASE in the last 168 hours. No results for input(s): AMMONIA in the last 168 hours. Coagulation Profile: No results for  input(s): INR, PROTIME in the last 168 hours. Cardiac Enzymes: No results for input(s): CKTOTAL, CKMB, CKMBINDEX, TROPONINI in the last 168 hours. BNP (last 3 results) No results for input(s): PROBNP in the last 8760 hours. HbA1C: No results for input(s): HGBA1C in the last 72 hours. CBG: No results for input(s): GLUCAP in the last 168 hours. Lipid Profile: No results for input(s): CHOL, HDL, LDLCALC, TRIG, CHOLHDL, LDLDIRECT in the last 72 hours. Thyroid Function Tests: No results for input(s): TSH, T4TOTAL, FREET4, T3FREE, THYROIDAB in the last 72 hours. Anemia Panel: No results for input(s): VITAMINB12, FOLATE, FERRITIN, TIBC, IRON, RETICCTPCT in the last 72 hours. Urine analysis:    Component Value Date/Time   COLORURINE YELLOW 10/16/2015 2354   APPEARANCEUR CLEAR 10/16/2015 2354   LABSPEC 1.007 10/16/2015 2354   PHURINE 7.0 10/16/2015 2354   GLUCOSEU NEGATIVE 10/16/2015 2354   HGBUR TRACE* 10/16/2015 2354   BILIRUBINUR NEGATIVE 10/16/2015 2354   KETONESUR  NEGATIVE 10/16/2015 2354   PROTEINUR NEGATIVE 10/16/2015 2354   UROBILINOGEN 0.2 05/04/2014 1042   NITRITE NEGATIVE 10/16/2015 2354   LEUKOCYTESUR SMALL* 10/16/2015 2354   Sepsis Labs: (procalcitonin:4,lacticidven:4) )No results found for this or any previous visit (from the past 240 hour(s)).   Radiological Exams on Admission: Dg Chest 2 View  10/17/2015  CLINICAL DATA:  Dizzy. Hypertension. Patient reports recent oral surgery. EXAM: CHEST  2 VIEW COMPARISON:  09/22/2013 FINDINGS: Lower lung volumes from prior exam leading to bronchovascular crowding. Normal heart size and mediastinal contours. No evidence of pulmonary edema. No focal airspace opacity, pleural effusion or pneumothorax. Osseous structures are unchanged. IMPRESSION: No acute process. Electronically Signed   By: Rubye Oaks M.D.   On: 10/17/2015 00:18   Ct Head Wo Contrast  10/17/2015  CLINICAL DATA:  Acute onset of dizziness and high blood pressure. Initial encounter. EXAM: CT HEAD WITHOUT CONTRAST TECHNIQUE: Contiguous axial images were obtained from the base of the skull through the vertex without intravenous contrast. COMPARISON:  None. FINDINGS: There is no evidence of acute infarction, mass lesion, or intra- or extra-axial hemorrhage on CT. The posterior fossa, including the cerebellum, brainstem and fourth ventricle, is within normal limits. The third and lateral ventricles, and basal ganglia are unremarkable in appearance. The cerebral hemispheres are symmetric in appearance, with normal gray-white differentiation. No mass effect or midline shift is seen. There is no evidence of fracture; visualized osseous structures are unremarkable in appearance. The visualized portions of the orbits are within normal limits. The paranasal sinuses and mastoid air cells are well-aerated. No significant soft tissue abnormalities are seen. IMPRESSION: Unremarkable noncontrast CT of the head. Electronically Signed   By: Roanna Raider M.D.   On: 10/17/2015 00:08    EKG: Independently reviewed. Normal sinus rhythm   Assessment/Plan  1. Hyponatremia  - Serum sodium is 122 on admission  - Suspect this is acute and multifactorial  - Likely secondary to poor appetite since dental surgery, increased free-water intake, and HCTZ use  - Patient appears dehydrated on admission and 1 L normal saline given in ED - Check urine and serum osmolality, urine lytes  - Hold HCTZ  - Goal is increase Na by 8-9 mmol/L in 24 hrs   - Saline-lock IV for now, fluid-restrict diet, follow q4h BMP overnight and adjust plan accordingly    2. Hypertension with hypertensive urgency  - BP 218/89 on arrival; no evidence of end-organ damage (mild HA likely d/t hyponatremia) - Managed with lisinopril-HCTZ at home  - HCTZ  held in light of hyponatremia; continue current-dose lisinopril 20 mg qD  - Hydralazine IVP's prn    3. Chronic pain  - Attributed to OA and stable  - Pain-control prn    4. GERD - Stable; no EGD on file  - Managed with prn Zantac at home  - Continue current management with prn H2-blocker   DVT prophylaxis: sq Lovenox  Code Status: Full  Family Communication: Updated husband at bedside   Disposition Plan: Observe on telemetry   Consults called: None  Admission status: Observation     Briscoe Deutscherimothy S Morghan Kester, MD [facility] Pager [phone]  If 7PM-7AM, please contact night-coverage [email or web address] Password [email or web address]  10/17/2015, 12:50 AM

## 2015-10-18 DIAGNOSIS — I10 Essential (primary) hypertension: Secondary | ICD-10-CM

## 2015-10-18 DIAGNOSIS — E871 Hypo-osmolality and hyponatremia: Secondary | ICD-10-CM | POA: Diagnosis not present

## 2015-10-18 LAB — BASIC METABOLIC PANEL
ANION GAP: 6 (ref 5–15)
BUN: 10 mg/dL (ref 6–20)
CALCIUM: 9 mg/dL (ref 8.9–10.3)
CO2: 29 mmol/L (ref 22–32)
CREATININE: 0.67 mg/dL (ref 0.44–1.00)
Chloride: 104 mmol/L (ref 101–111)
Glucose, Bld: 92 mg/dL (ref 65–99)
Potassium: 4.4 mmol/L (ref 3.5–5.1)
SODIUM: 139 mmol/L (ref 135–145)

## 2015-10-18 LAB — UREA NITROGEN, URINE: UREA NITROGEN UR: 62 mg/dL

## 2015-10-18 LAB — GLUCOSE, CAPILLARY: GLUCOSE-CAPILLARY: 91 mg/dL (ref 65–99)

## 2015-10-18 MED ORDER — LISINOPRIL 20 MG PO TABS: 20.0000 mg | ORAL_TABLET | Freq: Every day | ORAL | Status: DC

## 2015-10-18 NOTE — Progress Notes (Signed)
Patient is ambulatory, alert and oriented. Discharge instructions reviewed. Questions, concerns denied.

## 2015-10-18 NOTE — Care Management Obs Status (Signed)
MEDICARE OBSERVATION STATUS NOTIFICATION   Patient Details  Name: Rose Burgess MRN: [id number] Date of Birth: 12/08/1945   Medicare Observation Status Notification Given:  Yes    Lanier ClamMahabir, Chevette Fee, RN 10/18/2015, 10:46 AM

## 2015-10-18 NOTE — Discharge Summary (Signed)
Physician Discharge Summary  Rose Burgess ZOX:[id number] DOB: 02-23-1946 DOA: 10/16/2015  PCP: Pamelia Hoit, MD  Admit date: 10/16/2015 Discharge date: 10/18/2015  Time spent: >35 minutes  Recommendations for Outpatient Follow-up:  F/u with PCP in 1-2 weeks   Discharge Diagnoses:  Principal Problem:   Hyponatremia Active Problems:   Hypertensive urgency   Hypertension   GERD (gastroesophageal reflux disease)   Dizziness   Chronic pain   Discharge Condition: stable   Diet recommendation: regular   Filed Weights   10/17/15 0125 10/18/15 0605  Weight: 72.6 kg (160 lb 0.9 oz) 70.3 kg (154 lb 15.7 oz)    History of present illness: 69 y.o. female with medical history significant for hypertension, chronic pain, and GERD who presents to the ED with 1 day of dizziness, nausea, and malaise. Patient had been in her usual state of health and had an oral surgery with bone grafting on 10/14/2015. She tolerated the procedure well, but due to the associated dental pain, has been unable to tolerate much food at all. She reports developing some dizziness, nausea, and malaise the following day as well as dry mouth for which she has been drinking a lot of water. She reports drinking 6-8 bottles of water daily for the past 2 days and eating only a few bites of mashed potatoes and yogur   Hospital Course:  Principal Problem:  Hyponatremia Active Problems:  Hypertensive urgency  Hypertension  GERD (gastroesophageal reflux disease)  Dizziness  Chronic pain  Hyponatremia, multifactorial due polydipsia and taking hctz. Sodium improved with holding HCTZ.  -Na is 139 today, recommended  to cont to hold hctz and f/u with PCP in 1-2 weeks  Hypertension. BP is stable on lisinopril only. Will hold hctz at discharge due hyponatremia. F/u with PCP   Procedures:  none (i.e. Studies not automatically included, echos, thoracentesis, etc; not x-rays)  Consultations:  none  Discharge  Exam: Filed Vitals:   10/17/15 2039 10/18/15 0605  BP: 141/59 134/51  Pulse: 72 59  Temp: 98.6 F (37 C) 98 F (36.7 C)  Resp: 16 16    General: alert, no distress  Cardiovascular: s1,s2 rrr Respiratory: CTA BL   Discharge Instructions  Discharge Instructions    Diet - low sodium heart healthy    Complete by:  As directed      Discharge instructions    Complete by:  As directed   Please follow up with primary care doctor in 1-2 weeks     Increase activity slowly    Complete by:  As directed             Medication List    STOP taking these medications        lisinopril-hydrochlorothiazide 20-25 MG tablet  Commonly known as:  PRINZIDE,ZESTORETIC     naproxen sodium 550 MG tablet  Commonly known as:  ANAPROX      TAKE these medications        BIOTIN 5000 PO  Take 1 capsule by mouth every morning.     CALCIUM 600 + D PO  Take 1 tablet by mouth 2 (two) times daily.     DSS 100 MG Caps  Take 100 mg by mouth 2 (two) times daily.     ferrous sulfate 325 (65 FE) MG tablet  Take 1 tablet (325 mg total) by mouth 3 (three) times daily after meals.     fluticasone 50 MCG/ACT nasal spray  Commonly known as:  FLONASE  Place 2 sprays  into both nostrils daily.     HYDROcodone-acetaminophen 7.5-325 MG tablet  Commonly known as:  NORCO  Take 1-2 tablets by mouth every 4 (four) hours as needed for moderate pain.     lisinopril 20 MG tablet  Commonly known as:  PRINIVIL,ZESTRIL  Take 1 tablet (20 mg total) by mouth daily.     multivitamin with minerals Tabs tablet  Take 1 tablet by mouth every morning.     Omega 3 1200 MG Caps  Take 2 capsules by mouth every morning.     polyethylene glycol packet  Commonly known as:  MIRALAX / GLYCOLAX  Take 17 g by mouth 2 (two) times daily.     ranitidine 150 MG tablet  Commonly known as:  ZANTAC  Take 150 mg by mouth as needed for heartburn.     tiZANidine 4 MG capsule  Commonly known as:  ZANAFLEX  Take 1 capsule (4  mg total) by mouth 3 (three) times daily as needed for muscle spasms.     vitamin B-12 500 MCG tablet  Commonly known as:  CYANOCOBALAMIN  Take 500 mcg by mouth every morning.       No Known Allergies    The results of significant diagnostics from this hospitalization (including imaging, microbiology, ancillary and laboratory) are listed below for reference.    Significant Diagnostic Studies: Dg Chest 2 View  10/17/2015  CLINICAL DATA:  Dizzy. Hypertension. Patient reports recent oral surgery. EXAM: CHEST  2 VIEW COMPARISON:  09/22/2013 FINDINGS: Lower lung volumes from prior exam leading to bronchovascular crowding. Normal heart size and mediastinal contours. No evidence of pulmonary edema. No focal airspace opacity, pleural effusion or pneumothorax. Osseous structures are unchanged. IMPRESSION: No acute process. Electronically Signed   By: Rubye Oaks M.D.   On: 10/17/2015 00:18   Ct Head Wo Contrast  10/17/2015  CLINICAL DATA:  Acute onset of dizziness and high blood pressure. Initial encounter. EXAM: CT HEAD WITHOUT CONTRAST TECHNIQUE: Contiguous axial images were obtained from the base of the skull through the vertex without intravenous contrast. COMPARISON:  None. FINDINGS: There is no evidence of acute infarction, mass lesion, or intra- or extra-axial hemorrhage on CT. The posterior fossa, including the cerebellum, brainstem and fourth ventricle, is within normal limits. The third and lateral ventricles, and basal ganglia are unremarkable in appearance. The cerebral hemispheres are symmetric in appearance, with normal gray-white differentiation. No mass effect or midline shift is seen. There is no evidence of fracture; visualized osseous structures are unremarkable in appearance. The visualized portions of the orbits are within normal limits. The paranasal sinuses and mastoid air cells are well-aerated. No significant soft tissue abnormalities are seen. IMPRESSION: Unremarkable  noncontrast CT of the head. Electronically Signed   By: Roanna Raider M.D.   On: 10/17/2015 00:08    Microbiology: No results found for this or any previous visit (from the past 240 hour(s)).   Labs: Basic Metabolic Panel:  Recent Labs Lab 10/17/15 0100 10/17/15 0520 10/17/15 0910 10/17/15 1500 10/18/15 0413  NA 124* 129* 128* 134* 139  K 3.3* 3.5 3.7 4.1 4.4  CL 91* 94* 96* 100* 104  CO2 24 28 25 29 29   GLUCOSE 110* 98 110* 101* 92  BUN 13 11 9 10 10   CREATININE 0.52 0.63 0.56 0.59 0.67  CALCIUM 8.5* 9.1 8.7* 9.1 9.0  MG 1.3*  --   --   --   --    Liver Function Tests: No results for input(s): AST,  ALT, ALKPHOS, BILITOT, PROT, ALBUMIN in the last 168 hours. No results for input(s): LIPASE, AMYLASE in the last 168 hours. No results for input(s): AMMONIA in the last 168 hours. CBC:  Recent Labs Lab 10/16/15 2321 10/16/15 2328  WBC 8.2  --   NEUTROABS 5.2  --   HGB 13.2 13.6  HCT 35.8* 40.0  MCV 85.2  --   PLT 224  --    Cardiac Enzymes: No results for input(s): CKTOTAL, CKMB, CKMBINDEX, TROPONINI in the last 168 hours. BNP: BNP (last 3 results) No results for input(s): BNP in the last 8760 hours.  ProBNP (last 3 results) No results for input(s): PROBNP in the last 8760 hours.  CBG:  Recent Labs Lab 10/17/15 0741 10/18/15 0734  GLUCAP 88 91       Signed:  Wendy Hoback N  [facility] 10/18/2015, 8:33 AM

## 2015-10-18 NOTE — Care Management Note (Signed)
Case Management Note  Patient Details  Name: Rose Burgess MRN: [id number] Date of Birth: 05/04/1946  Subjective/Objective:  70 y/o f admitted w/hyponatremia.From home.                  Action/Plan:d/c home no needs or orders.   Expected Discharge Date:                  Expected Discharge Plan:  Home/Self Care  In-House Referral:     Discharge planning Services  CM Consult  Post Acute Care Choice:    Choice offered to:     DME Arranged:    DME Agency:     HH Arranged:    HH Agency:     Status of Service:  Completed, signed off  Medicare Important Message Given:    Date Medicare IM Given:    Medicare IM give by:    Date Additional Medicare IM Given:    Additional Medicare Important Message give by:     If discussed at Long Length of Stay Meetings, dates discussed:    Additional Comments:  Lanier ClamMahabir, Jamani Eley, RN 10/18/2015, 10:46 AM

## 2016-05-22 HISTORY — PX: CATARACT EXTRACTION: SUR2

## 2016-07-06 IMAGING — CT CT HEAD W/O CM
2 series · 15 of 30 positions shown, 19 images · non-contrast
Comparison: None.

CLINICAL DATA: Acute onset of dizziness and high blood pressure.
Initial encounter.

EXAM:
CT HEAD WITHOUT CONTRAST
TECHNIQUE: Contiguous axial images were obtained from the base of the skull
through the vertex without intravenous contrast.

[Series 2: head w/o · axial · non-contrast · 0.45mm/px · z∈[-148,-28]mm · 13 of 28 slices shown, 17 images]
[im 2/28  brain]
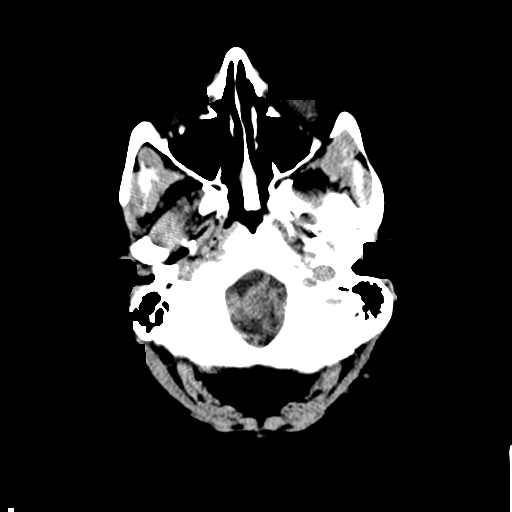
[im 2/28  bone]
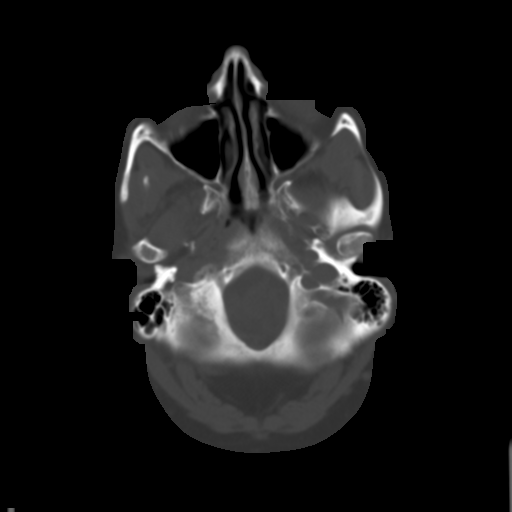
[im 4/28  brain]
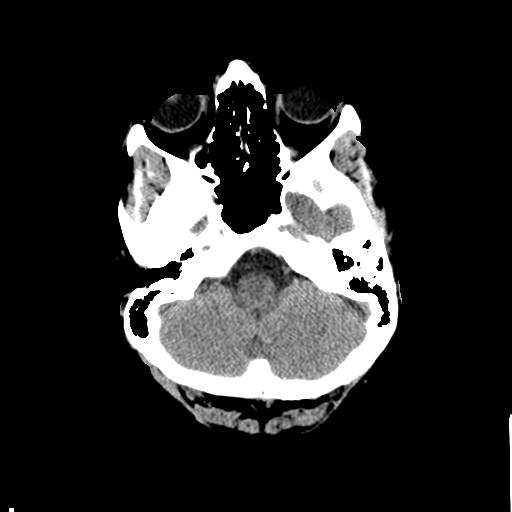
[im 6/28  brain]
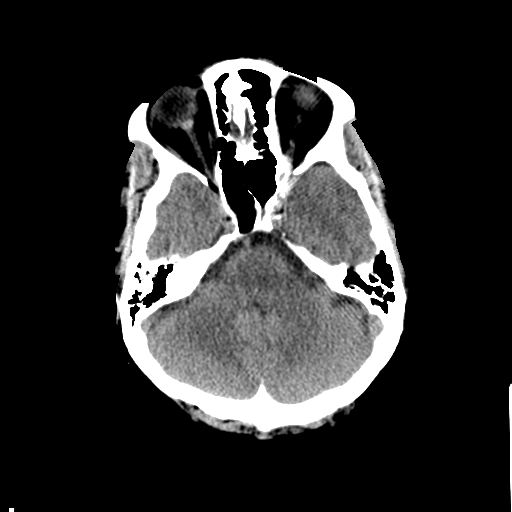
[im 8/28  brain]
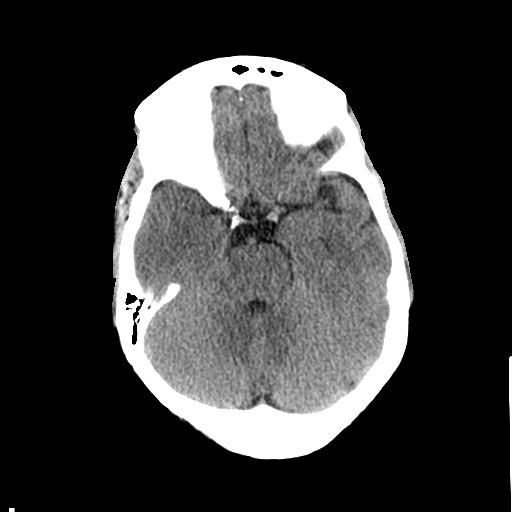
[im 10/28  brain]
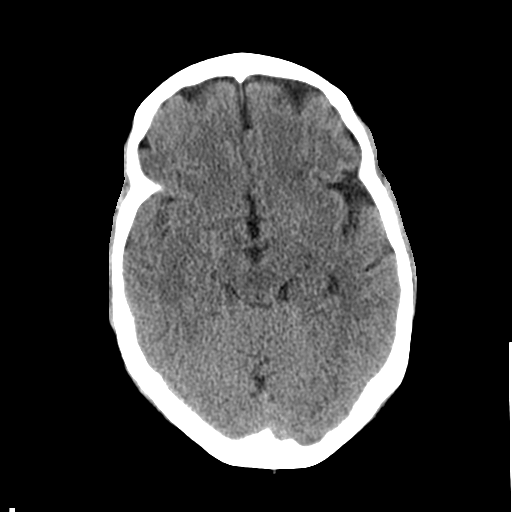
[im 10/28  bone]
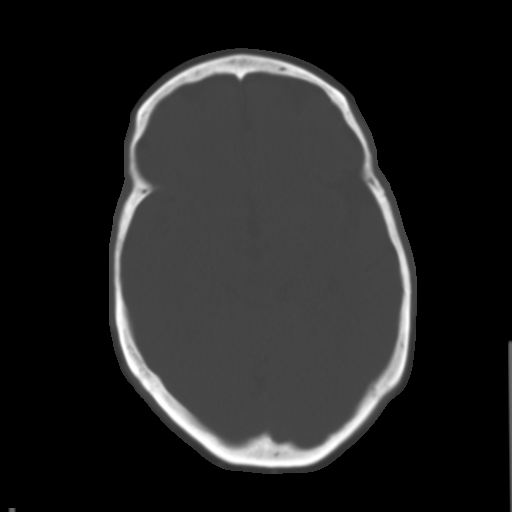
[im 12/28  brain]
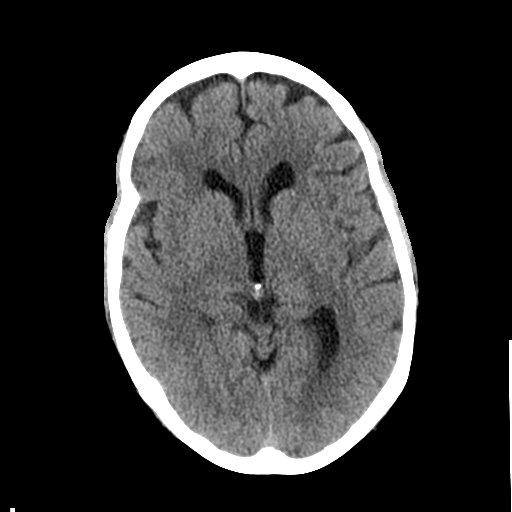
[im 14/28  brain]
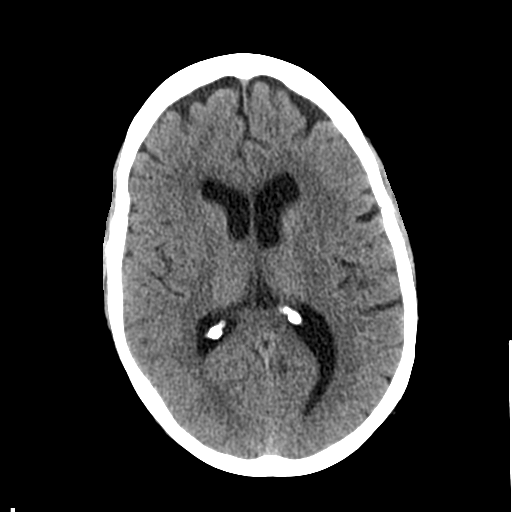
[im 16/28  brain]
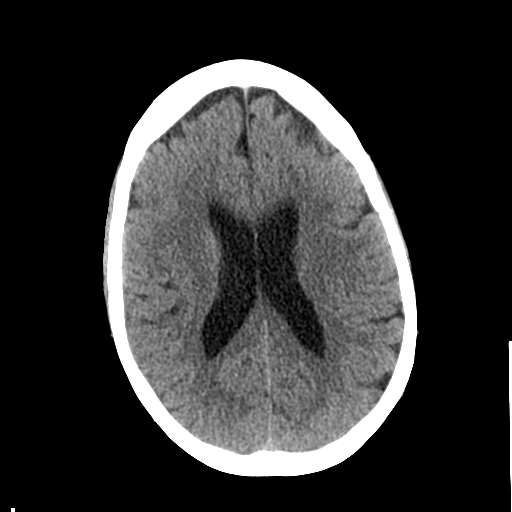
[im 18/28  brain]
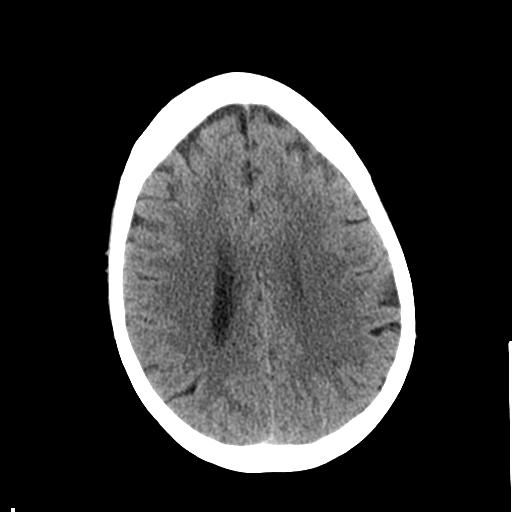
[im 18/28  bone]
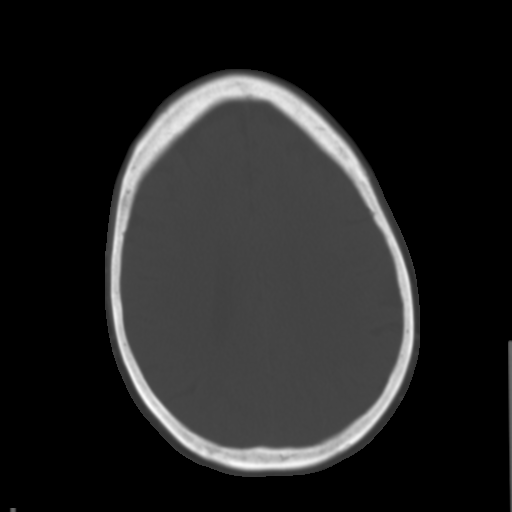
[im 20/28  brain]
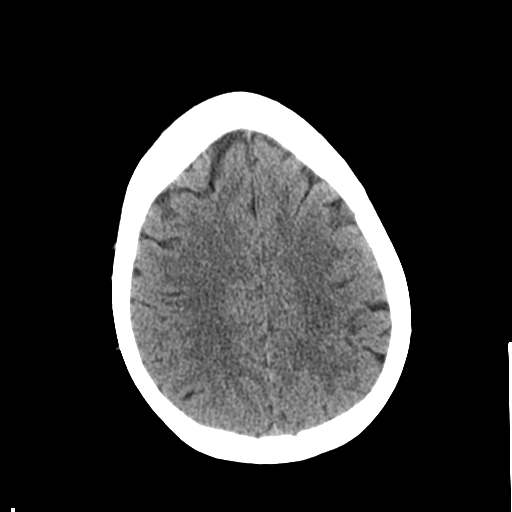
[im 22/28  brain]
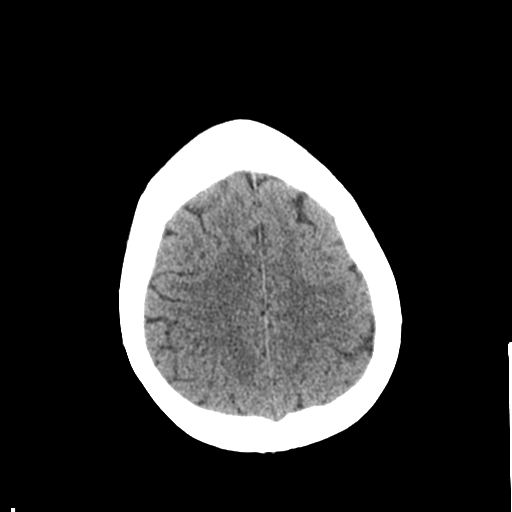
[im 24/28  brain]
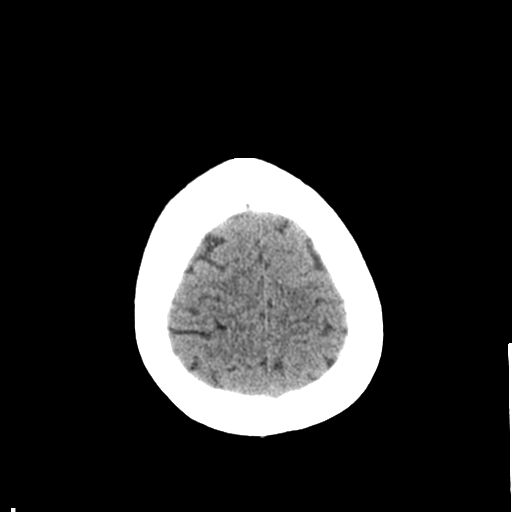
[im 26/28  brain]
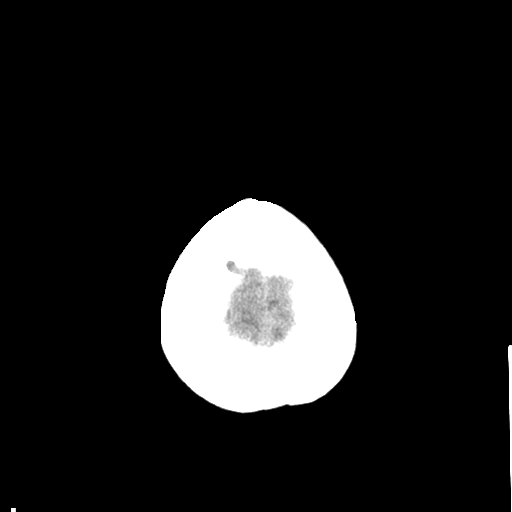
[im 26/28  bone]
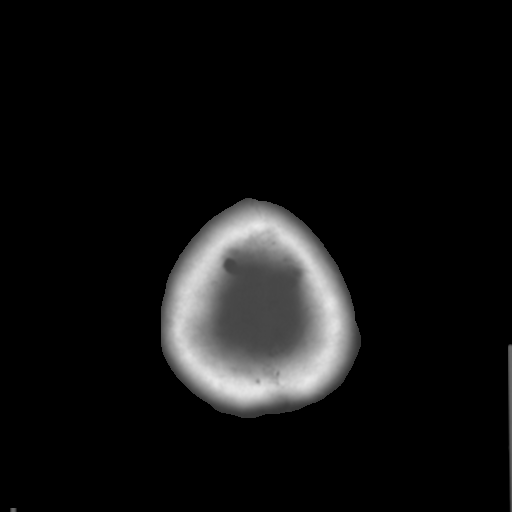

[Series 3: bone windows · axial · 0.45mm/px · z∈[-148,-128]mm · 2 of 28 slices shown]
[im 2/28  bone]
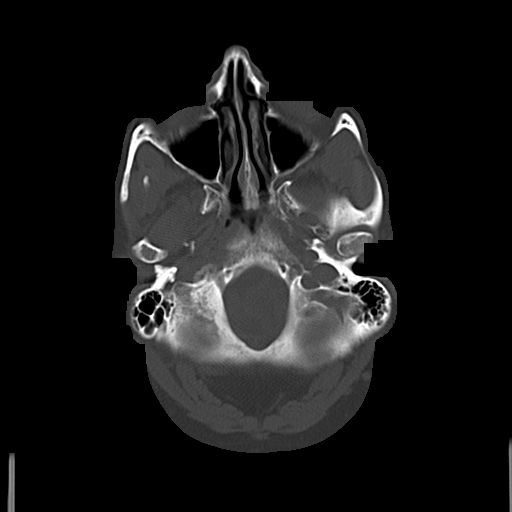
[im 6/28  bone]
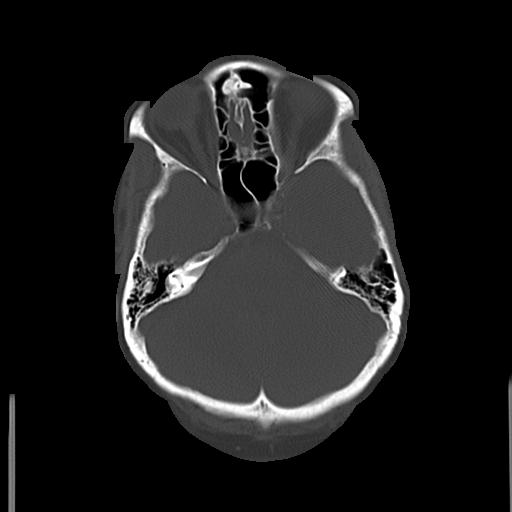

[15 of 30 positions shown; findings below may reference images not displayed]

FINDINGS: There is no evidence of acute infarction, mass lesion, or intra- or
extra-axial hemorrhage on CT.

The posterior fossa, including the cerebellum, brainstem and fourth
ventricle, is within normal limits. The third and lateral
ventricles, and basal ganglia are unremarkable in appearance. The
cerebral hemispheres are symmetric in appearance, with normal
gray-white differentiation. No mass effect or midline shift is seen.

There is no evidence of fracture; visualized osseous structures are
unremarkable in appearance. The visualized portions of the orbits
are within normal limits. The paranasal sinuses and mastoid air
cells are well-aerated. No significant soft tissue abnormalities are
seen.
IMPRESSION: Unremarkable noncontrast CT of the head.

## 2017-01-17 ENCOUNTER — Telehealth: Payer: Self-pay | Admitting: *Deleted

## 2017-01-17 ENCOUNTER — Encounter: Payer: Self-pay | Admitting: *Deleted

## 2017-01-17 NOTE — Telephone Encounter (Signed)
Referral fax from Beverley FiedlerVictoria Rankins, [facility] sent to scheduling.

## 2017-01-19 ENCOUNTER — Telehealth: Payer: Self-pay | Admitting: Cardiovascular Disease

## 2017-01-19 NOTE — Telephone Encounter (Signed)
[facility] for upcoming appointment on 02/19/17 @ 10:40am with Dr. Duke Salviaandolph. [facility]. 01/19/17/ab

## 2017-02-19 ENCOUNTER — Encounter: Payer: Self-pay | Admitting: Cardiovascular Disease

## 2017-02-19 ENCOUNTER — Ambulatory Visit (INDEPENDENT_AMBULATORY_CARE_PROVIDER_SITE_OTHER): Payer: Medicare Other | Admitting: Cardiovascular Disease

## 2017-02-19 VITALS — BP 183/76 | HR 66 | Ht 60.0 in | Wt 156.4 lb

## 2017-02-19 DIAGNOSIS — R011 Cardiac murmur, unspecified: Secondary | ICD-10-CM | POA: Diagnosis not present

## 2017-02-19 DIAGNOSIS — I10 Essential (primary) hypertension: Secondary | ICD-10-CM

## 2017-02-19 MED ORDER — CARVEDILOL 12.5 MG PO TABS: 12.5000 mg | ORAL_TABLET | Freq: Two times a day (BID) | ORAL | 5 refills | Status: DC

## 2017-02-19 NOTE — Patient Instructions (Addendum)
Medication Instructions:  STOP METOPROLOL   START CARVEDILOL 12.5 MG TWICE A DAY   Labwork: NONE  Testing/Procedures: Your physician has requested that you have an echocardiogram. Echocardiography is a painless test that uses sound waves to create images of your heart. It provides your doctor with information about the size and shape of your heart and how well your heart's chambers and valves are working. This procedure takes approximately one hour. There are no restrictions for this procedure.  [facility] AT [address]  Follow-Up: Your physician recommends that you schedule a follow-up appointment in: 4-6 WEEKS   Any Other Special Instructions Will Be Listed Below (If Applicable). MONITOR BLOOD PRESSURE AT HOME IF IT REMAINS ABOVE 130/80 INCREASE YOUR CARVEDILOL TO 25 MG TWICE A DAY   If you need a refill on your cardiac medications before your next appointment, please call your pharmacy.  Echocardiogram An echocardiogram, or echocardiography, uses sound waves (ultrasound) to produce an image of your heart. The echocardiogram is simple, painless, obtained within a short period of time, and offers valuable information to your health care provider. The images from an echocardiogram can provide information such as:  Evidence of coronary artery disease (CAD).  Heart size.  Heart muscle function.  Heart valve function.  Aneurysm detection.  Evidence of a past heart attack.  Fluid buildup around the heart.  Heart muscle thickening.  Assess heart valve function.  Tell a health care provider about:  Any allergies you have.  All medicines you are taking, including vitamins, herbs, eye drops, creams, and over-the-counter medicines.  Any problems you or family members have had with anesthetic medicines.  Any blood disorders you have.  Any surgeries you have had.  Any medical conditions you have.  Whether you are pregnant or may be pregnant. What happens  before the procedure? No special preparation is needed. Eat and drink normally. What happens during the procedure?  In order to produce an image of your heart, gel will be applied to your chest and a wand-like tool (transducer) will be moved over your chest. The gel will help transmit the sound waves from the transducer. The sound waves will harmlessly bounce off your heart to allow the heart images to be captured in real-time motion. These images will then be recorded.  You may need an IV to receive a medicine that improves the quality of the pictures. What happens after the procedure? You may return to your normal schedule including diet, activities, and medicines, unless your health care provider tells you otherwise. This information is not intended to replace advice given to you by your health care provider. Make sure you discuss any questions you have with your health care provider. Document Released: 05/05/2000 Document Revised: 12/25/2015 Document Reviewed: 01/13/2013 Elsevier Interactive Patient Education  2017 [employer].

## 2017-02-19 NOTE — Progress Notes (Signed)
Cardiology Office Note   Date:  02/19/2017   ID:  Rose Burgess, DOB November 17, 1945, MRN [id number]  PCP:  Clayborn Heron, MD  Cardiologist:   Chilton Si, MD   Chief Complaint  Patient presents with  . New Patient (Initial Visit)      History of Present Illness: Rose Burgess is a 71 y.o. female with hypertension, anxiety, prior GI bleed and GERD who presents for management of hypertension and evaluation of a murmur. Rose Burgess saw Dr. Barbaraann Barthel on 01/17/17 and reportedly difficult to manage hypertension. Her blood pressure at home was running in Rose 170s to 180s systolic.  Metoprolol was previously added to her regimen.  At that appointment she was also noted to have a heart murmur. She was referred to cardiology for further evaluation.  Rose Burgess was diagnosed with hypertension in her 51s.  It was initially well-controlled on lisinopril.  In Rose last year it has been harder to control.  Her blood pressure has been mostly in Rose 140s-170s/70s.  Two weeks ago she had an episode of chest tightness that started after eating a meal but she denies any exertional chest pain.  She rides a recumbent bike for exercise.    Rose Burgess reports feeling very anxious.  She has lorazepam that she uses sparingly but doesn't do any preventative medications.  Her anxiety became worse around 03/2017.  She doesn't remember anything particularly stressful that occurred at that time.   Past Medical History:  Diagnosis Date  . Acid reflux   . Arthritis   . Diverticulosis   . GERD (gastroesophageal reflux disease)   . H/O hiatal hernia   . Heart murmur   . History of blood transfusion    07/2012 - several blood transfusions per patient   . History of primary hypertension   . Hypertension   . Hyponatremia    h/o hyponatremia on diuretics  . Panic attack     Past Surgical History:  Procedure Laterality Date  . BUNIONECTOMY    . CATARACT EXTRACTION Right 2018  . colon surgery for  devierticulosis   2013   3'14 [place], [place]  . 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 TOTAL HIP ARTHROPLASTY Left 05/11/2014   Procedure: LEFT TOTAL HIP ARTHROPLASTY ANTERIOR APPROACH;  Surgeon: Shelda Pal, MD;  Location: [facility];  Service: Orthopedics;  Laterality: Left;  . TUBAL LIGATION       Current Outpatient Prescriptions  Medication Sig Dispense Refill  . BIOTIN 5000 PO Take 1 capsule by mouth every morning.     . ferrous sulfate 325 (65 FE) MG tablet Take 1 tablet (325 mg total) by mouth 3 (three) times daily after meals.  3  . fluticasone (FLONASE) 50 MCG/ACT nasal spray Place 2 sprays into both nostrils daily.  5  . lisinopril (PRINIVIL,ZESTRIL) 20 MG tablet Take 20 mg by mouth 2 (two) times daily.    Marland Kitchen LORazepam (ATIVAN) 0.5 MG tablet Take 1/2 to one tablet twice a day as needed for anxiety    . Multiple Vitamin (MULTIVITAMIN WITH MINERALS) TABS tablet Take 1 tablet by mouth every morning.     . Omega 3 1200 MG CAPS Take 2 capsules by mouth every morning.     . ranitidine (ZANTAC) 150 MG tablet Take 150 mg by mouth as needed for heartburn.    Marland Kitchen  vitamin B-12 (CYANOCOBALAMIN) 500 MCG tablet Take 500 mcg by mouth every morning.     . carvedilol (COREG) 12.5 MG tablet Take 1 tablet (12.5 mg total) by mouth 2 (two) times daily. 60 tablet 5   No current facility-administered medications for this visit.     Allergies:   Patient has no known allergies.    Social History:  Rose patient  reports that she has never smoked. She has never used smokeless tobacco. She reports that she does not drink alcohol or use drugs.   Family History:  Rose Burgess family history includes Colon cancer in her mother; Mitral valve prolapse in her father, paternal aunt, and sister.    ROS:  Please see Rose history of present illness.   Otherwise,  review of systems are positive for none.   All other systems are reviewed and negative.    PHYSICAL EXAM: VS:  BP (!) 183/76   Pulse 66   Ht 5' (1.524 m)   Wt 70.9 kg (156 lb 6.4 oz)   BMI 30.54 kg/m  , BMI Body mass index is 30.54 kg/m. GENERAL:  Well appearing. No ac HEENT:  Pupils equal round and reactive, fundi not visualized, oral mucosa unremarkable NECK:  No jugular venous distention, waveform within normal limits, carotid upstroke brisk and symmetric, no bruits, no thyromegaly LYMPHATICS:  No cervical adenopathy LUNGS:  Clear to auscultation bilaterally HEART:  RRR.  PMI not displaced or sustained,S1 and S2 within normal limits, no S3, no S4, no clicks, no rubs, II/VI systolic murmur at Rose LUSB and apex ABD:  Flat, positive bowel sounds normal in frequency in pitch, no bruits, no rebound, no guarding, no midline pulsatile mass, no hepatomegaly, no splenomegaly EXT:  2 plus pulses throughout, no edema, no cyanosis no clubbing SKIN:  No rashes no nodules NEURO:  Cranial nerves II through XII grossly intact, motor grossly intact throughout PSYCH:  Cognitively intact, oriented to person place and time   EKG:  EKG is ordered today. Rose ekg ordered today demonstrates sinus rhythm. Rate 66 bpm. Occasional PVCs.   Recent Labs: No results found for requested labs within last 8760 hours.    12/26/16: Total cholesterol 163, triglycerides 96, HDL 65, LDL 79 Sodium 135, potassium 4.5, BUN 13, creatinine 0.7. AST 17, ALT 15  Lipid Panel No results found for: CHOL, TRIG, HDL, CHOLHDL, VLDL, LDLCALC, LDLDIRECT    Wt Readings from Last 3 Encounters:  02/19/17 70.9 kg (156 lb 6.4 oz)  10/18/15 70.3 kg (154 lb 15.7 oz)  05/11/14 75.3 kg (166 lb)      ASSESSMENT AND PLAN:  # Hypertension:  Blood pressure is above her goal of 130/80.  We will stop metoprolol and start carvedilol 12.5mg  bid.  If her blood pressure remains above goal she can increase this to 25 mg twice daily.    Continue lisinopril 20 mg twice daily. She will continue to log her blood pressures.  She is very anxious and I suspect   # Systolic murmur:  Unclear if this is due to an LVOT gradient or possibly MR.  It doesn't sound like aortic stenosis.    Current medicines are reviewed at length with Rose patient today.  Rose patient does not have concerns regarding medicines.  Rose following changes have been made:  Switch metoprolol to carvedilol  Labs/ tests ordered today include:   Orders Placed This Encounter  Procedures  . EKG 12-Lead  . ECHOCARDIOGRAM COMPLETE     Disposition:  FU with Isauro Skelley C. Duke Salvia, MD, [facility], [place] in 4-6 weeks   This note was written with Rose assistance of speech recognition software.  Please excuse any transcriptional errors.  Signed, Steffie Waggoner C. Duke Salvia, MD, [facility]  02/19/2017 6:30 PM    [facility]

## 2017-03-05 ENCOUNTER — Other Ambulatory Visit: Payer: Self-pay

## 2017-03-05 ENCOUNTER — Ambulatory Visit (HOSPITAL_COMMUNITY): Payer: Medicare Other | Attending: Cardiovascular Disease

## 2017-03-05 DIAGNOSIS — I080 Rheumatic disorders of both mitral and aortic valves: Secondary | ICD-10-CM | POA: Insufficient documentation

## 2017-03-05 DIAGNOSIS — R011 Cardiac murmur, unspecified: Secondary | ICD-10-CM | POA: Diagnosis present

## 2017-03-06 ENCOUNTER — Telehealth: Payer: Self-pay | Admitting: *Deleted

## 2017-03-06 MED ORDER — CARVEDILOL 25 MG PO TABS: 25.0000 mg | ORAL_TABLET | Freq: Two times a day (BID) | ORAL | 5 refills | Status: DC

## 2017-03-06 NOTE — Telephone Encounter (Signed)
Spoke with patient and she had to double her Coreg as discussed at last ov to 25 mg twice a day. Her blood pressure doing much better now. Will send new Rx to [facility] and she will keep follow up 03/29/17

## 2017-03-15 ENCOUNTER — Telehealth: Payer: Self-pay | Admitting: *Deleted

## 2017-03-15 DIAGNOSIS — I350 Nonrheumatic aortic (valve) stenosis: Secondary | ICD-10-CM

## 2017-03-15 NOTE — Telephone Encounter (Signed)
-----   Message from Chilton Siiffany [place], MD sent at 03/07/2017  9:13 AM EDT ----- Heart is squeezing normally.  Mild aortic stenosis and mild mitral regurgitation.  This doesn't cause symptoms at this stage.  Repeat echo in one year.

## 2017-03-15 NOTE — Telephone Encounter (Signed)
Left message to call back  

## 2017-03-16 NOTE — Telephone Encounter (Signed)
Notes recorded by Chilton Siandolph, Tiffany, MD on 03/07/2017 at 9:13 AM EDT Heart is squeezing normally. Mild aortic stenosis and mild mitral regurgitation. This doesn't cause symptoms at this stage. Repeat echo in one year.

## 2017-03-16 NOTE — Telephone Encounter (Signed)
°  Follow Up   Returning call regarding blood results. Please call.

## 2017-03-16 NOTE — Telephone Encounter (Signed)
Future order entered  Pt notified

## 2017-03-29 ENCOUNTER — Ambulatory Visit: Payer: Medicare Other | Admitting: Cardiovascular Disease

## 2017-03-29 ENCOUNTER — Encounter: Payer: Self-pay | Admitting: Cardiovascular Disease

## 2017-03-29 VITALS — BP 162/60 | HR 66 | Ht 60.0 in | Wt 159.0 lb

## 2017-03-29 DIAGNOSIS — I10 Essential (primary) hypertension: Secondary | ICD-10-CM

## 2017-03-29 DIAGNOSIS — I350 Nonrheumatic aortic (valve) stenosis: Secondary | ICD-10-CM | POA: Diagnosis not present

## 2017-03-29 HISTORY — DX: Nonrheumatic aortic (valve) stenosis: I35.0

## 2017-03-29 MED ORDER — AMLODIPINE BESYLATE 5 MG PO TABS: 5.0000 mg | ORAL_TABLET | Freq: Every day | ORAL | 1 refills | Status: DC

## 2017-03-29 NOTE — Progress Notes (Signed)
Cardiology Office Note   Date:  03/29/2017   ID:  Rose Burgess, DOB 09/07/1945, MRN [id number]  PCP:  Clayborn Heron, MD  Cardiologist:   Chilton Si, MD   No chief complaint on file.    History of Present Illness: Rose Burgess is a 71 y.o. female with hypertension, mild aortic stenosis, anxiety, prior GI bleed and GERD who presents for management of hypertension and evaluation of a murmur. Rose Burgess saw Dr. Barbaraann Barthel on 01/17/17 and reportedly difficult to manage hypertension. Her blood pressure at home was running in the 170s to 180s systolic.  Metoprolol was previously added to her regimen.  At that appointment she was also noted to have a heart murmur. She was referred to cardiology for further evaluation.  Rose Burgess was diagnosed with hypertension in her 34s.  It was initially well-controlled on lisinopril.  In the last year it has been harder to control.  Her blood pressure has been mostly in the 140s-170s/70s.  Two weeks ago she had an episode of chest tightness that started after eating a meal but she denies any exertional chest pain.  She rides a recumbent bike for exercise.    At her last appointment Rose Burgess blood pressure was poorly-controlled.  Metoprolol was switched to carvedilol.  She was noted to have a systolic murmur on exam and was referred for an echo 03/05/17 that revealed LVEF 60-65% with mild aortic stenosis and mild mitral regurgitation.  Mean aortic valve gradient was 14 mmHg.  She has been feeling well physically.  She is checking her blood pressure regularly and it is been running mostly in the 130s-160s.  She has not experienced any low blood pressures.  She takes her medications every day as prescribed.  She limits the salt in her diet.  She has many questions today about her aortic stenosis diagnosis and prognosis.   Past Medical History:  Diagnosis Date  . Acid reflux   . Arthritis   . Diverticulosis   . GERD (gastroesophageal reflux  disease)   . H/O hiatal hernia   . Heart murmur   . History of blood transfusion    07/2012 - several blood transfusions per patient   . History of primary hypertension   . Hypertension   . Hyponatremia    h/o hyponatremia on diuretics  . Mild aortic stenosis 03/29/2017   Mean gradient 14 mmHg on echo 02/2017.  Marland Kitchen Panic attack     Past Surgical History:  Procedure Laterality Date  . BUNIONECTOMY    . CATARACT EXTRACTION Right 2018  . colon surgery for devierticulosis   2013   3'14 [place], [place]  . TUBAL LIGATION       Current Outpatient Medications  Medication Sig Dispense Refill  . BIOTIN 5000 PO Take 1 capsule by mouth every morning.     . Calcium Carbonate-Vitamin D (CALCIUM HIGH POTENCY/VITAMIN D) 600-200 MG-UNIT TABS Take 1 tablet 2 (two) times daily by mouth.     . carvedilol (COREG) 25 MG tablet Take 1 tablet (25 mg total) by mouth 2 (two) times daily. 60 tablet 5  . fluticasone (FLONASE) 50 MCG/ACT nasal spray Place 2 sprays as needed into both nostrils for allergies.   5  . lisinopril (PRINIVIL,ZESTRIL) 20 MG tablet Take 20 mg by mouth 2 (two) times daily.    Marland Kitchen LORazepam (ATIVAN) 0.5 MG tablet Take 1/2 to one tablet twice a day as needed for anxiety    . Multiple Vitamin (  MULTIVITAMIN WITH MINERALS) TABS tablet Take 1 tablet by mouth every morning.     . Omega 3 1200 MG CAPS Take 2 capsules by mouth every morning.     . ranitidine (ZANTAC) 150 MG tablet Take 150 mg by mouth as needed for heartburn.    . TURMERIC PO Take 1 capsule daily by mouth. PATIENT NOT SURE OF DOSAGE    . vitamin B-12 (CYANOCOBALAMIN) 500 MCG tablet Take 500 mcg by mouth every morning.     Marland Kitchen. VITAMIN E PO Take 1 capsule daily by mouth. PATIENT NOT SURE OF DOSAGE    . amLODipine (NORVASC) 5 MG tablet Take 1 tablet (5 mg total) daily by mouth. 90 tablet 1   No current facility-administered medications for this visit.     Allergies:   Patient has no known allergies.    Social History:  The patient   reports that  has never smoked. she has never used smokeless tobacco. She reports that she does not drink alcohol or use drugs.   Family History:  The patient's family history includes Colon cancer in her mother; Mitral valve prolapse in her father, paternal aunt, and sister.    ROS:  Please see the history of present illness.   Otherwise, review of systems are positive for none.   All other systems are reviewed and negative.    PHYSICAL EXAM: VS:  BP (!) 162/60   Pulse 66   Ht 5' (1.524 m)   Wt 72.1 kg (159 lb)   SpO2 98%   BMI 31.05 kg/m  , BMI Body mass index is 31.05 kg/m. GENERAL:  Well appearing.  No acute distress HEENT: Pupils equal round and reactive, fundi not visualized, oral mucosa unremarkable NECK:  No jugular venous distention, waveform within normal limits, carotid upstroke brisk and symmetric, no bruits, no thyromegaly LYMPHATICS:  No cervical adenopathy LUNGS:  Clear to auscultation bilaterally HEART:  RRR.  PMI not displaced or sustained,S1 and S2 within normal limits, no S3, no S4, no clicks, no rubs, II/VI systolic murmur at the LUSB.  ABD:  Flat, positive bowel sounds normal in frequency in pitch, no bruits, no rebound, no guarding, no midline pulsatile mass, no hepatomegaly, no splenomegaly EXT:  2 plus pulses throughout, no edema, no cyanosis no clubbing SKIN:  No rashes no nodules NEURO:  Cranial nerves II through XII grossly intact, motor grossly intact throughout PSYCH:  Cognitively intact, oriented to person place and time   EKG:  EKG is ordered today. The ekg ordered today demonstrates sinus rhythm. Rate 66 bpm. Occasional PVCs.  Echo 03/05/17: Study Conclusions  - Left ventricle: The cavity size was normal. There was mild focal   basal hypertrophy of the septum. Systolic function was normal.   The estimated ejection fraction was in the range of 60% to 65%.   Left ventricular diastolic function parameters were normal. - Aortic valve: There was  mild stenosis. There was mild   regurgitation. Valve area (VTI): 1.32 cm^2. Valve area (Vmax):   1.31 cm^2. Valve area (Vmean): 1.1 cm^2. - Mitral valve: Calcified annulus. Mildly thickened leaflets .   There was mild regurgitation. - Atrial septum: No defect or patent foramen ovale was identified. - Pulmonary arteries: PA peak pressure: 33 mm Hg (S). Recent Labs: No results found for requested labs within last 8760 hours.    12/26/16: Total cholesterol 163, triglycerides 96, HDL 65, LDL 79 Sodium 135, potassium 4.5, BUN 13, creatinine 0.7. AST 17, ALT 15  Lipid  Panel No results found for: CHOL, TRIG, HDL, CHOLHDL, VLDL, LDLCALC, LDLDIRECT    Wt Readings from Last 3 Encounters:  03/29/17 72.1 kg (159 lb)  02/19/17 70.9 kg (156 lb 6.4 oz)  10/18/15 70.3 kg (154 lb 15.7 oz)      ASSESSMENT AND PLAN:  # Mild aortic stenosis:  Mean gradient 14 mmHg.  Repeat echo 02/2018.  We spent 20 minutes discussing aortic stenosis, the prognosis and treatment options.    # Hypertension:  Blood pressure remains above goal.  Continue lisinopril and carvedilol.  Add amlodipine 5mg  daily.  She will continue to monitor her BP at home.      Current medicines are reviewed at length with the patient today.  The patient does not have concerns regarding medicines.  The following changes have been made:  Switch metoprolol to carvedilol  Labs/ tests ordered today include:   No orders of the defined types were placed in this encounter.    Disposition:   FU with Dontavius Keim C. Duke Salviaandolph, MD, [facility] in 2 months.    This note was written with the assistance of speech recognition software.  Please excuse any transcriptional errors.  Signed, Maitlyn Penza C. Duke Salviaandolph, MD, [facility]  03/29/2017 11:09 AM    [facility]

## 2017-03-29 NOTE — Patient Instructions (Signed)
Medication Instructions:  START AMLODIPINE 5 MG DAILY   Labwork: NONE  Testing/Procedures: NONE  Follow-Up: Your physician recommends that you schedule a follow-up appointment in: 2 MONTH OV   If you need a refill on your cardiac medications before your next appointment, please call your pharmacy.  

## 2017-05-21 DIAGNOSIS — K219 Gastro-esophageal reflux disease without esophagitis: Secondary | ICD-10-CM | POA: Insufficient documentation

## 2017-05-21 DIAGNOSIS — Z8719 Personal history of other diseases of the digestive system: Secondary | ICD-10-CM | POA: Insufficient documentation

## 2017-05-21 DIAGNOSIS — K5790 Diverticulosis of intestine, part unspecified, without perforation or abscess without bleeding: Secondary | ICD-10-CM | POA: Insufficient documentation

## 2017-05-21 DIAGNOSIS — Z9289 Personal history of other medical treatment: Secondary | ICD-10-CM | POA: Insufficient documentation

## 2017-05-21 DIAGNOSIS — M1990 Unspecified osteoarthritis, unspecified site: Secondary | ICD-10-CM | POA: Insufficient documentation

## 2017-05-21 DIAGNOSIS — Z8679 Personal history of other diseases of the circulatory system: Secondary | ICD-10-CM | POA: Insufficient documentation

## 2017-05-21 DIAGNOSIS — R011 Cardiac murmur, unspecified: Secondary | ICD-10-CM | POA: Insufficient documentation

## 2017-05-21 DIAGNOSIS — F410 Panic disorder [episodic paroxysmal anxiety] without agoraphobia: Secondary | ICD-10-CM | POA: Insufficient documentation

## 2017-06-18 ENCOUNTER — Encounter: Payer: Self-pay | Admitting: Cardiovascular Disease

## 2017-06-18 ENCOUNTER — Ambulatory Visit: Payer: Medicare Other | Admitting: Cardiovascular Disease

## 2017-06-18 VITALS — BP 140/80 | HR 55 | Ht 60.0 in | Wt 157.2 lb

## 2017-06-18 DIAGNOSIS — I350 Nonrheumatic aortic (valve) stenosis: Secondary | ICD-10-CM | POA: Diagnosis not present

## 2017-06-18 DIAGNOSIS — I10 Essential (primary) hypertension: Secondary | ICD-10-CM

## 2017-06-18 MED ORDER — CARVEDILOL 25 MG PO TABS: 25.0000 mg | ORAL_TABLET | Freq: Two times a day (BID) | ORAL | 3 refills | Status: DC

## 2017-06-18 MED ORDER — AMLODIPINE BESYLATE 10 MG PO TABS: 10.0000 mg | ORAL_TABLET | Freq: Every day | ORAL | 1 refills | Status: DC

## 2017-06-18 NOTE — Progress Notes (Signed)
Cardiology Office Note   Date:  06/18/2017   ID:  Rose Burgess, DOB Aug 13, 1945, MRN [id number]  PCP:  Rose Heron, MD  Cardiologist:   Rose Si, MD   Chief Complaint  Patient presents with  . Follow-up     History of Present Illness: Rose Burgess is a 72 y.o. female with hypertension, mild aortic stenosis, anxiety, prior GI bleed and GERD who presents for follow-up.  She was initially seen 02/2017 for management of hypertension and evaluation of a murmur. [name] saw Dr. Barbaraann Burgess on 01/17/17 and reportedly difficult to manage hypertension. Her blood pressure at home was running in the 170s to 180s systolic.  Metoprolol was switched to carvedilol.  She was noted to have a systolic murmur and was referred for an echo 03/05/17 that revealed LVEF 60-65% with mild aortic stenosis and mild mitral regurgitation.  Mean aortic valve gradient was 14 mmHg.  At her last appointment amlodipine was added due to poorly controlled hypertension.  Her blood pressure at home has been ranging from 120-150/60s-70.  Her heart rate is in the 50s.  She has been feeling well.  However, she is struggling with right knee pain that has limited her exercise.  She also reports some weight gain that occurred over the holidays.  She has no chest pain or shortness of breath.  She also denies lower extremity edema, orthopnea, PND, lightheadedness, or dizziness.   Past Medical History:  Diagnosis Date  . Acid reflux   . Arthritis   . Diverticulosis   . GERD (gastroesophageal reflux disease)   . H/O hiatal hernia   . Heart murmur   . History of blood transfusion    07/2012 - several blood transfusions per patient   . History of primary hypertension   . Hypertension   . Hyponatremia    h/o hyponatremia on diuretics  . Mild aortic stenosis 03/29/2017   Mean gradient 14 mmHg on echo 02/2017.  Marland Kitchen Panic attack     Past Surgical History:  Procedure Laterality Date  . BUNIONECTOMY    . CATARACT  EXTRACTION Right 2018  . colon surgery for devierticulosis   2013   3'14 [place], [place]  . 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 TOTAL HIP ARTHROPLASTY Left 05/11/2014   Procedure: LEFT TOTAL HIP ARTHROPLASTY ANTERIOR APPROACH;  Surgeon: Shelda Pal, MD;  Location: [facility];  Service: Orthopedics;  Laterality: Left;  . TUBAL LIGATION       Current Outpatient Medications  Medication Sig Dispense Refill  . amLODipine (NORVASC) 10 MG tablet Take 1 tablet (10 mg total) by mouth daily. 90 tablet 1  . BIOTIN 5000 PO Take 1 capsule by mouth every morning.     . Calcium Carbonate-Vitamin D (CALCIUM HIGH POTENCY/VITAMIN D) 600-200 MG-UNIT TABS Take 1 tablet 2 (two) times daily by mouth.     . fluticasone (FLONASE) 50 MCG/ACT nasal spray Place 2 sprays as needed into both nostrils for allergies.   5  . lisinopril (PRINIVIL,ZESTRIL) 20 MG tablet Take 20 mg by mouth 2 (two) times daily.    Marland Kitchen LORazepam (ATIVAN) 0.5 MG tablet Take 1/2 to one tablet twice a day as needed for anxiety    . Multiple Vitamin (MULTIVITAMIN WITH MINERALS) TABS tablet Take 1 tablet by mouth every morning.     Marland Kitchen  Omega 3 1200 MG CAPS Take 2 capsules by mouth every morning.     . ranitidine (ZANTAC) 150 MG tablet Take 150 mg by mouth as needed for heartburn.    . TURMERIC PO Take 1 capsule daily by mouth. PATIENT NOT SURE OF DOSAGE    . vitamin B-12 (CYANOCOBALAMIN) 500 MCG tablet Take 500 mcg by mouth every morning.     Marland Kitchen. VITAMIN E PO Take 1 capsule daily by mouth. PATIENT NOT SURE OF DOSAGE    . carvedilol (COREG) 25 MG tablet Take 1 tablet (25 mg total) by mouth 2 (two) times daily. 180 tablet 3   No current facility-administered medications for this visit.     Allergies:   Patient has no known allergies.    Social History:  The patient  reports that  has never smoked.  she has never used smokeless tobacco. She reports that she does not drink alcohol or use drugs.   Family History:  The patient's family history includes Colon cancer in her mother; Mitral valve prolapse in her father, paternal aunt, and sister.    ROS:  Please see the history of present illness.   Otherwise, review of systems are positive for none.   All other systems are reviewed and negative.    PHYSICAL EXAM: VS:  BP 140/80   Pulse (!) 55   Ht 5' (1.524 m)   Wt 157 lb 3.2 oz (71.3 kg)   SpO2 99%   BMI 30.70 kg/m  , BMI Body mass index is 30.7 kg/m. GENERAL:  Well appearing.  No acute distress. HEENT: Pupils equal round and reactive, fundi not visualized, oral mucosa unremarkable NECK:  No jugular venous distention, waveform within normal limits, carotid upstroke brisk and symmetric, no bruits, no thyromegaly LUNGS:  Clear to auscultation bilaterally HEART:  RRR.  PMI not displaced or sustained,S1 and S2 within normal limits, no S3, no S4, no clicks, no rubs, no murmurs ABD:  Flat, positive bowel sounds normal in frequency in pitch, no bruits, no rebound, no guarding, no midline pulsatile mass, no hepatomegaly, no splenomegaly EXT:  2 plus pulses throughout, no edema, no cyanosis no clubbing SKIN:  No rashes no nodules NEURO:  Cranial nerves II through XII grossly intact, motor grossly intact throughout PSYCH:  Cognitively intact, oriented to person place and time   EKG:  EKG is not ordered today. The ekg ordered 02/19/17 demonstrates sinus rhythm. Rate 66 bpm. Occasional PVCs.  Echo 03/05/17: Study Conclusions  - Left ventricle: The cavity size was normal. There was mild focal   basal hypertrophy of the septum. Systolic function was normal.   The estimated ejection fraction was in the range of 60% to 65%.   Left ventricular diastolic function parameters were normal. - Aortic valve: There was mild stenosis. There was mild   regurgitation. Valve area (VTI): 1.32 cm^2. Valve  area (Vmax):   1.31 cm^2. Valve area (Vmean): 1.1 cm^2. - Mitral valve: Calcified annulus. Mildly thickened leaflets .   There was mild regurgitation. - Atrial septum: No defect or patent foramen ovale was identified. - Pulmonary arteries: PA peak pressure: 33 mm Hg (S). Recent Labs: No results found for requested labs within last 8760 hours.    12/26/16: Total cholesterol 163, triglycerides 96, HDL 65, LDL 79 Sodium 135, potassium 4.5, BUN 13, creatinine 0.7. AST 17, ALT 15  Lipid Panel No results found for: CHOL, TRIG, HDL, CHOLHDL, VLDL, LDLCALC, LDLDIRECT    Wt Readings from Last 3 Encounters:  06/18/17 157 lb 3.2 oz (71.3 kg)  03/29/17 159 lb (72.1 kg)  02/19/17 156 lb 6.4 oz (70.9 kg)      ASSESSMENT AND PLAN:  # Mild aortic stenosis:  Mean gradient 14 mmHg.  Repeat echo 02/2018.   # Hypertension:  Blood pressure remains above goal but has improved significantly.  Continue lisinopril and carvedilol.  We will increase amlodipine to 10 mg daily.  She will continue to check her blood pressures and call next week if it remains greater than 130/80.  At that time we will start hydrochlorothiazide if needed.    Current medicines are reviewed at length with the patient today.  The patient does not have concerns regarding medicines.  The following changes have been made:  Switch metoprolol to carvedilol  Labs/ tests ordered today include:   No orders of the defined types were placed in this encounter.    Disposition:   FU with Rose Loudon C. Duke Salvia, MD, [facility] in 4 months.  PharmD in 1 month.    This note was written with the assistance of speech recognition software.  Please excuse any transcriptional errors.  Signed, Rose Burgess C. Duke Salvia, MD, [facility]  06/18/2017 10:28 AM    [facility]

## 2017-06-18 NOTE — Patient Instructions (Signed)
Medication Instructions:  INCREASE YOUR AMLODIPINE TO 10 MG DAILY   Labwork: NONE  Testing/Procedures: NONE  Follow-Up: Your physician recommends that you schedule a follow-up appointment in: 1 MONTH FOR PHARM D  Your physician recommends that you schedule a follow-up appointment in: 4 MONTH OV WITH [facility]   Any Other Special Instructions Will Be Listed Below (If Applicable). MONITOR YOUR BLOOD PRESSURE AT HOME AND CALL IN 1 WEEK  IF YOUR BLOOD PRESSURE DOES NOT REMAIN BELOW 130/80  If you need a refill on your cardiac medications before your next appointment, please call your pharmacy.

## 2017-07-26 ENCOUNTER — Ambulatory Visit: Payer: Medicare Other | Admitting: Pharmacist

## 2017-07-26 VITALS — BP 140/62 | HR 58

## 2017-07-26 DIAGNOSIS — I10 Essential (primary) hypertension: Secondary | ICD-10-CM | POA: Diagnosis not present

## 2017-07-26 NOTE — Patient Instructions (Addendum)
Return for a  follow up appointment in 6 weeks  Check your blood pressure at home daily (if able) and keep record of the readings.  Take your BP meds as follows: NO CHANGE IN MEDICATION  Bring all  your BP cuff and your record of home blood pressures to your next appointment.  Exercise as you're able, try to walk approximately 30 minutes per day.  Keep salt intake to a minimum, especially watch canned and prepared boxed foods.  Eat more fresh fruits and vegetables and fewer canned items.  Avoid eating in fast food restaurants.    HOW TO TAKE YOUR BLOOD PRESSURE: . Rest 5 minutes before taking your blood pressure. .  Don't smoke or drink caffeinated beverages for at least 30 minutes before. . Take your blood pressure before (not after) you eat. . Sit comfortably with your back supported and both feet on the floor (don't cross your legs). . Elevate your arm to heart level on a table or a desk. . Use the proper sized cuff. It should fit smoothly and snugly around your bare upper arm. There should be enough room to slip a fingertip under the cuff. The bottom edge of the cuff should be 1 inch above the crease of the elbow. . Ideally, take 3 measurements at one sitting and record the average.

## 2017-07-26 NOTE — Progress Notes (Signed)
Patient ID: Rose Burgess                 DOB: [birth year]                      MRN: [id number]     HPI: Rose Burgess is a 72 y.o. female referred by Dr. Duke Salvia to HTN clinic. PMH is significant for hypertension, mild aortic stenosis, a previous GI bleed, GERD, and anxiety. Her previous home blood pressure readings have ranged from 120-150 mmHg systolic over 60-70 mmHg diastolic. Amlodipine was increased to 10 mg at last office visit with DR Duke Salvia. Patient presents to [place] today for blood pressure follow-up. Patient denies any palpitations, swelling, headaches. Patient has noticed some dizziness and increased fatigue in the past week. She went to [facility] for 8 days prior to clinic visit to her daughter's house and she is unsure if being out of town is what caused her symptoms. Prior to being out of town patient reports no symptoms of dizziness or increased fatigue.   Current HTN meds:  Amlodipine 10 mg daily  Carvedilol 25 mg twice daily  Lisinopril 20 mg twice daily  Previously tried:  Carvedilol 12.5 mg twice daily Lisinopril-hydrochlorothiazide 20-25 mg daily Metoprolol succinate 50 mg daily  BP goal:  130/80 mmHg   Family History:  Father, paternal aunt, and sister have a history of a mitral valve prolapse.  Diet:  Does not add extra salt to food. Does eat take-out every now and then. Patient has cut out caffeine from diet completely.   Exercise:  Patient uses exercise bike daily for 30-35 minutes.  Home BP readings:  19 readings ; average daily readings 117/59 mmHg  Wt Readings from Last 3 Encounters:  06/18/17 157 lb 3.2 oz (71.3 kg)  03/29/17 159 lb (72.1 kg)  02/19/17 156 lb 6.4 oz (70.9 kg)   BP Readings from Last 3 Encounters:  07/26/17 140/62  06/18/17 140/80  03/29/17 (!) 162/60   Pulse Readings from Last 3 Encounters:  07/26/17 (!) 58  06/18/17 (!) 55  03/29/17 66    Past Medical History:  Diagnosis Date  . Acid reflux   . Arthritis   .  Diverticulosis   . GERD (gastroesophageal reflux disease)   . H/O hiatal hernia   . Heart murmur   . History of blood transfusion    07/2012 - several blood transfusions per patient   . History of primary hypertension   . Hypertension   . Hyponatremia    h/o hyponatremia on diuretics  . Mild aortic stenosis 03/29/2017   Mean gradient 14 mmHg on echo 02/2017.  Marland Kitchen Panic attack     Current Outpatient Medications on File Prior to Visit  Medication Sig Dispense Refill  . amLODipine (NORVASC) 10 MG tablet Take 1 tablet (10 mg total) by mouth daily. 90 tablet 1  . BIOTIN 5000 PO Take 1 capsule by mouth every morning.     . Calcium Carbonate-Vitamin D (CALCIUM HIGH POTENCY/VITAMIN D) 600-200 MG-UNIT TABS Take 1 tablet 2 (two) times daily by mouth.     . carvedilol (COREG) 25 MG tablet Take 1 tablet (25 mg total) by mouth 2 (two) times daily. 180 tablet 3  . fluticasone (FLONASE) 50 MCG/ACT nasal spray Place 2 sprays as needed into both nostrils for allergies.   5  . lisinopril (PRINIVIL,ZESTRIL) 20 MG tablet Take 20 mg by mouth 2 (two) times daily.    Marland Kitchen LORazepam (ATIVAN) 0.5  MG tablet Take 1/2 to one tablet twice a day as needed for anxiety    . Multiple Vitamin (MULTIVITAMIN WITH MINERALS) TABS tablet Take 1 tablet by mouth every morning.     . Omega 3 1200 MG CAPS Take 2 capsules by mouth every morning.     . ranitidine (ZANTAC) 150 MG tablet Take 150 mg by mouth as needed for heartburn.    . TURMERIC PO Take 1 capsule daily by mouth. PATIENT NOT SURE OF DOSAGE    . vitamin B-12 (CYANOCOBALAMIN) 500 MCG tablet Take 500 mcg by mouth every morning.     Marland Kitchen. VITAMIN E PO Take 1 capsule daily by mouth. PATIENT NOT SURE OF DOSAGE     No current facility-administered medications on file prior to visit.     No Known Allergies  Blood pressure 140/62, pulse (!) 58, SpO2 98 %.  Hypertension Patient's home BP readings have been within the goal range of 130/80 mmHg. Patient's BP reading in clinic  today was slightly elevated at 140/62 mmHg, however the patient was anxious.   Patient has been tolerating amlodipine 10 mg increase well over the past month. She is unsure if the dizziness and fatigue is due to the medication or from being out of town 8 days prior to clinic visit. Will continue current medication regimen for now. Patient was encouraged to call us in a week and let us know if she continues to notice any symptoms of dizziness or increased fatigue. If patient is still experiencing symptoms, will consider decreasing carvedilol dose. Patient is scheduled for a follow-up appointment in 6 weeks with the blood pressure clinic.   Note written by: Sherene SiresAlexandra Petty, PharmD Candidate.    Assessment and plan discussed and approved by  Zeke Aker Rodriguez-Guzman PharmD, BCPS, CPP [facility] [address] [place] [phone] 07/27/2017 1:00 PM

## 2017-07-26 NOTE — Assessment & Plan Note (Addendum)
Patient's home BP readings have been within the goal range of 130/80 mmHg. Patient's BP reading in clinic today was slightly elevated at 140/62 mmHg, however the patient was anxious.   Patient has been tolerating amlodipine 10 mg increase well over the past month. She is unsure if the dizziness and fatigue is due to the medication or from being out of town 8 days prior to clinic visit. Will continue current medication regimen for now. Patient was encouraged to call us in a week and let us know if she continues to notice any symptoms of dizziness or increased fatigue. If patient is still experiencing symptoms, will consider decreasing carvedilol dose. Patient is scheduled for a follow-up appointment in 6 weeks with the blood pressure clinic.

## 2017-07-27 ENCOUNTER — Encounter: Payer: Self-pay | Admitting: Pharmacist

## 2017-09-06 ENCOUNTER — Ambulatory Visit (INDEPENDENT_AMBULATORY_CARE_PROVIDER_SITE_OTHER): Payer: Medicare Other | Admitting: Pharmacist

## 2017-09-06 VITALS — BP 138/64 | HR 54

## 2017-09-06 DIAGNOSIS — I10 Essential (primary) hypertension: Secondary | ICD-10-CM | POA: Diagnosis not present

## 2017-09-06 NOTE — Progress Notes (Signed)
Patient ID: Rose Burgess                 DOB: 10-06-1945                      MRN: [id number]     HPI: Rose Burgess is a 72 y.o. female referred by Dr. Duke Salvia to HTN clinic. PMH is significant for hypertension, mild aortic stenosis, a previous GI bleed, GERD, and anxiety. Her previous home blood pressure readings have ranged from 120-150 mmHg systolic over 60-70 mmHg diastolic. Amlodipine was increased to 10 mg at last office visit with Dr Duke Salvia. Patient denies any palpitations, headaches, increased fatigue or dizziness. Patient has noticed some swelling on her ankles during the day but resolved during the night.  Current HTN meds:  Amlodipine 10 mg daily  Carvedilol 25 mg twice daily  Lisinopril 20 mg twice daily  Previously tried:  Carvedilol 12.5 mg twice daily Lisinopril-hydrochlorothiazide 20-25 mg daily Metoprolol succinate 50 mg daily  BP goal:  <130/80 mmHg   Family History:  Father, paternal aunt, and sister have a history of a mitral valve prolapse.  Diet:  Does not add extra salt to food. Does eat take-out every now and then. Patient has cut out caffeine from diet completely.   Exercise:  Patient uses exercise bike daily for 30-35 minutes.  Home BP readings:  20 readings; average reading129/63 (HR 53-60 bpm) Omron arm cuff accurate with less than difference from office visit reading    Wt Readings from Last 3 Encounters:  06/18/17 157 lb 3.2 oz (71.3 kg)  03/29/17 159 lb (72.1 kg)  02/19/17 156 lb 6.4 oz (70.9 kg)   BP Readings from Last 3 Encounters:  09/06/17 138/64  07/26/17 140/62  06/18/17 140/80   Pulse Readings from Last 3 Encounters:  09/06/17 (!) 54  07/26/17 (!) 58  06/18/17 (!) 55    Past Medical History:  Diagnosis Date  . Acid reflux   . Arthritis   . Diverticulosis   . GERD (gastroesophageal reflux disease)   . H/O hiatal hernia   . Heart murmur   . History of blood transfusion    07/2012 - several blood transfusions  per patient   . History of primary hypertension   . Hypertension   . Hyponatremia    h/o hyponatremia on diuretics  . Mild aortic stenosis 03/29/2017   Mean gradient 14 mmHg on echo 02/2017.  Marland Kitchen Panic attack     Current Outpatient Medications on File Prior to Visit  Medication Sig Dispense Refill  . amLODipine (NORVASC) 10 MG tablet Take 1 tablet (10 mg total) by mouth daily. 90 tablet 1  . BIOTIN 5000 PO Take 1 capsule by mouth every morning.     . Calcium Carbonate-Vitamin D (CALCIUM HIGH POTENCY/VITAMIN D) 600-200 MG-UNIT TABS Take 1 tablet 2 (two) times daily by mouth.     . carvedilol (COREG) 25 MG tablet Take 1 tablet (25 mg total) by mouth 2 (two) times daily. 180 tablet 3  . fluticasone (FLONASE) 50 MCG/ACT nasal spray Place 2 sprays as needed into both nostrils for allergies.   5  . lisinopril (PRINIVIL,ZESTRIL) 20 MG tablet Take 20 mg by mouth 2 (two) times daily.    Marland Kitchen LORazepam (ATIVAN) 0.5 MG tablet Take 1/2 to one tablet twice a day as needed for anxiety    . Multiple Vitamin (MULTIVITAMIN WITH MINERALS) TABS tablet Take 1 tablet by mouth every morning.     Marland Kitchen  Omega 3 1200 MG CAPS Take 2 capsules by mouth every morning.     . ranitidine (ZANTAC) 150 MG tablet Take 150 mg by mouth as needed for heartburn.    . TURMERIC PO Take 1 capsule daily by mouth. PATIENT NOT SURE OF DOSAGE    . vitamin B-12 (CYANOCOBALAMIN) 500 MCG tablet Take 500 mcg by mouth every morning.     Marland Kitchen. VITAMIN E PO Take 1 capsule daily by mouth. PATIENT NOT SURE OF DOSAGE     No current facility-administered medications on file prior to visit.     No Known Allergies  Blood pressure 138/64, pulse (!) 54, SpO2 99 %.  Hypertension Blood pressure is good today but slightly higher than average home BP reading of 129/63 mmHg . Her home device to determined to be accurate with less than 5mmHg difference from manual reading. Patient has some degree of "white coat syndrome" and get very anxious at the doctors  office. Will continue current therapy without changes and follow up as needed. Patient is to follow up with Dr Duke Salviaandolph in 8 weeks as previously scheduled.   Takeesha Isley Rodriguez-Guzman PharmD, BCPS, CPP [facility] [address] [place] [phone] 09/07/2017 4:41 PM

## 2017-09-06 NOTE — Patient Instructions (Signed)
Return for a  follow up appointment as needed  Check your blood pressure at home daily (if able) and keep record of the readings.  Take your BP meds as follows: *NO CHANGE IN MEDICATION TODAY*  Bring all your BP cuff and your record of home blood pressures to your next appointment.  Exercise as you're able, try to walk approximately 30 minutes per day.  Keep salt intake to a minimum, especially watch canned and prepared boxed foods.  Eat more fresh fruits and vegetables and fewer canned items.  Avoid eating in fast food restaurants.    HOW TO TAKE YOUR BLOOD PRESSURE: . Rest 5 minutes before taking your blood pressure. .  Don't smoke or drink caffeinated beverages for at least 30 minutes before. . Take your blood pressure before (not after) you eat. . Sit comfortably with your back supported and both feet on the floor (don't cross your legs). . Elevate your arm to heart level on a table or a desk. . Use the proper sized cuff. It should fit smoothly and snugly around your bare upper arm. There should be enough room to slip a fingertip under the cuff. The bottom edge of the cuff should be 1 inch above the crease of the elbow. . Ideally, take 3 measurements at one sitting and record the average.

## 2017-09-07 ENCOUNTER — Encounter: Payer: Self-pay | Admitting: Pharmacist

## 2017-09-07 NOTE — Assessment & Plan Note (Signed)
Blood pressure is good today but slightly higher than average home BP reading of 129/63 mmHg . Her home device to determined to be accurate with less than 5mmHg difference from manual reading. Patient has some degree of "white coat syndrome" and get very anxious at the doctors office. Will continue current therapy without changes and follow up as needed. Patient is to follow up with Dr Duke Salviaandolph in 8 weeks as previously scheduled.

## 2017-11-15 ENCOUNTER — Encounter: Payer: Self-pay | Admitting: Cardiovascular Disease

## 2017-11-15 ENCOUNTER — Ambulatory Visit: Payer: Medicare Other | Admitting: Cardiovascular Disease

## 2017-11-15 VITALS — BP 121/68 | HR 54 | Ht 59.0 in | Wt 160.4 lb

## 2017-11-15 DIAGNOSIS — I10 Essential (primary) hypertension: Secondary | ICD-10-CM

## 2017-11-15 DIAGNOSIS — I350 Nonrheumatic aortic (valve) stenosis: Secondary | ICD-10-CM | POA: Diagnosis not present

## 2017-11-15 NOTE — Progress Notes (Signed)
Cardiology Office Note   Date:  11/15/2017   ID:  Rose EmBonnie B Dieu, DOB 01/02/1946, MRN [id number]  PCP:  Clayborn Heronankins, Victoria R, MD  Cardiologist:   Chilton Siiffany [place], MD   Chief Complaint  Patient presents with  . Follow-up     History of Present Illness: Rose Burgess is a 72 y.o. female with hypertension, mild aortic stenosis, anxiety, prior GI bleed and GERD who presents for follow-up.  She was initially seen 02/2017 for management of hypertension and evaluation of a murmur. Ms. Blenda NicelyKetner saw Dr. Barbaraann Barthelankins on 01/17/17 and reportedly difficult to manage hypertension. Her blood pressure at home was running in the 170s to 180s systolic.  Metoprolol was switched to carvedilol.  She was noted to have a systolic murmur and was referred for an echo 03/05/17 that revealed LVEF 60-65% with mild aortic stenosis and mild mitral regurgitation.  Mean aortic valve gradient was 14 mmHg.  Amlodipine was added due to poorly controlled hypertension.  H  Since her last appointment Ms. Blenda NicelyKetner has been doing well.  Her BP has been mostly in the 120s.  Her heart rate has been in the 50s.  She has no lightheaded she also denies chest pain or shortness of breath.  She rides a recumbent bike forness or dizziness.  45 minutes 3 days/week.  She has no exertional symptoms.  She denies lower extremity edema, orthopnea, or PND.  She would like to try to lose weight.  She thinks she may try weight watchers.  She has had success with this in the past.  She and her family are planning a cruise to [place] in 2020.   Past Medical History:  Diagnosis Date  . Acid reflux   . Arthritis   . Diverticulosis   . GERD (gastroesophageal reflux disease)   . H/O hiatal hernia   . Heart murmur   . History of blood transfusion    07/2012 - several blood transfusions per patient   . History of primary hypertension   . Hypertension   . Hyponatremia    h/o hyponatremia on diuretics  . Mild aortic stenosis 03/29/2017   Mean gradient 14  mmHg on echo 02/2017.  Marland Kitchen. Panic attack     Past Surgical History:  Procedure Laterality Date  . BUNIONECTOMY    . CATARACT EXTRACTION Right 2018  . colon surgery for devierticulosis   2013   [address] [place]  . TOTAL HIP ARTHROPLASTY Right 09/30/2013   Procedure: RIGHT TOTAL HIP ARTHROPLASTY ANTERIOR APPROACH;  Surgeon: Shelda PalMatthew D Olin, MD;  Location: [facility];  Service: Orthopedics;  Laterality: Right;  . TOTAL HIP ARTHROPLASTY Left 05/11/2014   Procedure: LEFT TOTAL HIP ARTHROPLASTY ANTERIOR APPROACH;  Surgeon: Shelda PalMatthew D Olin, MD;  Location: [facility];  Service: Orthopedics;  Laterality: Left;  . TUBAL LIGATION       Current Outpatient Medications  Medication Sig Dispense Refill  . amLODipine (NORVASC) 10 MG tablet Take 1 tablet (10 mg total) by mouth daily. 90 tablet 1  . BIOTIN 5000 PO Take 1 capsule by mouth every morning.     . Calcium Carbonate-Vitamin D (CALCIUM HIGH POTENCY/VITAMIN D) 600-200 MG-UNIT TABS Take 1 tablet 2 (two) times daily by mouth.     . carvedilol (COREG) 25 MG tablet Take 1 tablet (25 mg total) by mouth 2 (two) times daily. 180 tablet 3  . Coenzyme Q10 (EQL COQ10) 300 MG CAPS Take by mouth daily.    . fluticasone (FLONASE) 50 MCG/ACT nasal  spray Place 2 sprays as needed into both nostrils for allergies.   5  . lisinopril (PRINIVIL,ZESTRIL) 20 MG tablet Take 20 mg by mouth 2 (two) times daily.    Marland Kitchen LORazepam (ATIVAN) 0.5 MG tablet Take 1/2 to one tablet twice a day as needed for anxiety    . Multiple Vitamin (MULTIVITAMIN WITH MINERALS) TABS tablet Take 1 tablet by mouth every morning.     . Omega 3 1200 MG CAPS Take 2 capsules by mouth every morning.     . ranitidine (ZANTAC) 150 MG tablet Take 150 mg by mouth as needed for heartburn.    . TURMERIC PO Take 1 capsule daily by mouth. PATIENT NOT SURE OF DOSAGE    . vitamin B-12 (CYANOCOBALAMIN) 500 MCG tablet Take 500 mcg by mouth every morning.     Marland Kitchen VITAMIN E PO Take 1 capsule daily by mouth. PATIENT NOT SURE OF  DOSAGE     No current facility-administered medications for this visit.     Allergies:   Patient has no known allergies.    Social History:  The patient  reports that she has never smoked. She has never used smokeless tobacco. She reports that she does not drink alcohol or use drugs.   Family History:  The patient's family history includes Colon cancer in her mother; Mitral valve prolapse in her father, paternal aunt, and sister.    ROS:  Please see the history of present illness.   Otherwise, review of systems are positive for none.   All other systems are reviewed and negative.    PHYSICAL EXAM: VS:  BP 121/68   Pulse (!) 54   Ht 4\' 11"  (1.499 m)   Wt 160 lb 6.4 oz (72.8 kg)   BMI 32.40 kg/m  , BMI Body mass index is 32.4 kg/m. GENERAL:  Well appearing HEENT: Pupils equal round and reactive, fundi not visualized, oral mucosa unremarkable NECK:  No jugular venous distention, waveform within normal limits, carotid upstroke brisk and symmetric, no bruits LUNGS:  Clear to auscultation bilaterally HEART:  RRR.  PMI not displaced or sustained,S1 and S2 within normal limits, no S3, no S4, no clicks, no rubs, II/VI early peaking systolic murmur at the LUSB ABD:  Flat, positive bowel sounds normal in frequency in pitch, no bruits, no rebound, no guarding, no midline pulsatile mass, no hepatomegaly, no splenomegaly EXT:  2 plus pulses throughout, no edema, no cyanosis no clubbing SKIN:  No rashes no nodules NEURO:  Cranial nerves II through XII grossly intact, motor grossly intact throughout PSYCH:  Cognitively intact, oriented to person place and time   EKG:  EKG is not ordered today. The ekg ordered 02/19/17 demonstrates sinus rhythm. Rate 66 bpm. Occasional PVCs. 11/15/17: Sinus bradycardia.  Rate 54 bpm.    Echo 03/05/17: Study Conclusions  - Left ventricle: The cavity size was normal. There was mild focal   basal hypertrophy of the septum. Systolic function was normal.   The  estimated ejection fraction was in the range of 60% to 65%.   Left ventricular diastolic function parameters were normal. - Aortic valve: There was mild stenosis. There was mild   regurgitation. Valve area (VTI): 1.32 cm^2. Valve area (Vmax):   1.31 cm^2. Valve area (Vmean): 1.1 cm^2. - Mitral valve: Calcified annulus. Mildly thickened leaflets .   There was mild regurgitation. - Atrial septum: No defect or patent foramen ovale was identified. - Pulmonary arteries: PA peak pressure: 33 mm Hg (S). Recent Labs:  No results found for requested labs within last 8760 hours.    12/26/16: Total cholesterol 163, triglycerides 96, HDL 65, LDL 79 Sodium 135, potassium 4.5, BUN 13, creatinine 0.7. AST 17, ALT 15  Lipid Panel No results found for: CHOL, TRIG, HDL, CHOLHDL, VLDL, LDLCALC, LDLDIRECT    Wt Readings from Last 3 Encounters:  11/15/17 160 lb 6.4 oz (72.8 kg)  06/18/17 157 lb 3.2 oz (71.3 kg)  03/29/17 159 lb (72.1 kg)      ASSESSMENT AND PLAN:  # Mild aortic stenosis:  Mean gradient 14 mmHg.  Repeat echo 02/2018. She is asymptomatic and her murmur is very mild on exam.   # Hypertension:  Blood pressure well-controlled.  Continue amlodipine,, carvedilol, and lisinopril.    Current medicines are reviewed at length with the patient today.  The patient does not have concerns regarding medicines.  The following changes have been made:  Switch metoprolol to carvedilol  Labs/ tests ordered today include:   Orders Placed This Encounter  Procedures  . EKG 12-Lead    Disposition:   FU with Chike Farrington C. Duke Salvia, MD, [facility] in 1 year.    Signed, Britny Riel C. Duke Salvia, MD, [facility]  11/15/2017 3:57 PM    [facility]

## 2017-11-15 NOTE — Patient Instructions (Signed)
Your physician wants you to follow-up in: ONE YEAR with Dr. Freeland. You will receive a reminder letter in the mail two months in advance. If you don't receive a letter, please call our office to schedule the follow-up appointment.  

## 2017-12-08 ENCOUNTER — Other Ambulatory Visit: Payer: Self-pay | Admitting: Cardiovascular Disease

## 2017-12-10 NOTE — Telephone Encounter (Signed)
Rx sent to pharmacy   

## 2018-03-11 ENCOUNTER — Other Ambulatory Visit: Payer: Self-pay

## 2018-03-11 ENCOUNTER — Ambulatory Visit (HOSPITAL_COMMUNITY): Payer: Medicare Other | Attending: Cardiovascular Disease

## 2018-03-11 DIAGNOSIS — I350 Nonrheumatic aortic (valve) stenosis: Secondary | ICD-10-CM | POA: Insufficient documentation

## 2018-03-18 ENCOUNTER — Telehealth: Payer: Self-pay | Admitting: *Deleted

## 2018-03-18 DIAGNOSIS — I350 Nonrheumatic aortic (valve) stenosis: Secondary | ICD-10-CM

## 2018-03-18 NOTE — Telephone Encounter (Signed)
-----   Message from Chilton Si, MD sent at 03/17/2018  9:56 PM EDT ----- Echo shows that her heart squeezes well but does not relax completely.  This is a mild change and will not cause symptoms unless it worsens.  It will be important to keep her blood pressure under good control.  Mild aortic valve stenosis.  Repeat echo in 1 year.

## 2018-03-18 NOTE — Telephone Encounter (Signed)
Advised patient, verbalized understanding  

## 2018-03-19 ENCOUNTER — Other Ambulatory Visit: Payer: Self-pay | Admitting: Family Medicine

## 2018-03-19 DIAGNOSIS — I739 Peripheral vascular disease, unspecified: Secondary | ICD-10-CM

## 2018-04-22 ENCOUNTER — Ambulatory Visit
Admission: RE | Admit: 2018-04-22 | Discharge: 2018-04-22 | Disposition: A | Discharge status: Home / Self Care | Payer: Medicare Other | Source: Ambulatory Visit | Attending: Family Medicine | Admitting: Family Medicine

## 2018-04-22 DIAGNOSIS — I739 Peripheral vascular disease, unspecified: Secondary | ICD-10-CM

## 2018-06-09 ENCOUNTER — Other Ambulatory Visit: Payer: Self-pay | Admitting: Cardiovascular Disease

## 2018-08-03 ENCOUNTER — Other Ambulatory Visit: Payer: Self-pay | Admitting: Cardiovascular Disease

## 2019-01-31 ENCOUNTER — Other Ambulatory Visit: Payer: Self-pay | Admitting: Cardiovascular Disease

## 2019-04-25 ENCOUNTER — Encounter: Payer: Self-pay | Admitting: Cardiovascular Disease

## 2019-04-25 ENCOUNTER — Ambulatory Visit: Payer: Medicare Other | Admitting: Cardiovascular Disease

## 2019-04-25 ENCOUNTER — Other Ambulatory Visit: Payer: Self-pay

## 2019-04-25 VITALS — BP 142/60 | HR 64 | Ht 60.0 in | Wt 158.0 lb

## 2019-04-25 DIAGNOSIS — I10 Essential (primary) hypertension: Secondary | ICD-10-CM | POA: Diagnosis not present

## 2019-04-25 DIAGNOSIS — I350 Nonrheumatic aortic (valve) stenosis: Secondary | ICD-10-CM | POA: Diagnosis not present

## 2019-04-25 NOTE — Patient Instructions (Signed)
Medication Instructions:  Your physician recommends that you continue on your current medications as directed. Please refer to the Current Medication list given to you today. *If you need a refill on your cardiac medications before your next appointment, please call your pharmacy*  Lab Work: NONE If you have labs (blood work) drawn today and your tests are completely normal, you will receive your results only by: Marland Kitchen MyChart Message (if you have MyChart) OR . A paper copy in the mail If you have any lab test that is abnormal or we need to change your treatment, we will call you to review the results.  Testing/Procedures: Your physician has requested that you have an echocardiogram. Echocardiography is a painless test that uses sound waves to create images of your heart. It provides your doctor with information about the size and shape of your heart and how well your heart's chambers and valves are working. This procedure takes approximately one hour. There are no restrictions for this procedure. 04/28/2020 at 1:05 AT [place] [address]  Follow-Up: At [facility], you and your health needs are our priority.  As part of our continuing mission to provide you with exceptional heart care, we have created designated Provider Care Teams.  These Care Teams include your primary Cardiologist (physician) and Advanced Practice Providers (APPs -  Physician Assistants and Nurse Practitioners) who all work together to provide you with the care you need, when you need it.  Your next appointment:   12 month(s) AFTER ECHO You will receive a reminder letter in the mail two months in advance. If you don't receive a letter, please call our office to schedule the follow-up appointment.   The format for your next appointment:   Either In Person or Virtual  Provider:   You may see DR [facility]  or one of the following Advanced Practice Providers on your designated Care Team:    Kerin Ransom,  PA-C  [place], [place]  Coletta Memos, [place]

## 2019-04-25 NOTE — Progress Notes (Addendum)
Cardiology Office Note   Date:  04/25/2019   ID:  Rose Burgess, DOB [birth year], MRN [id number]  PCP:  Clayborn Heron, MD  Cardiologist:   Chilton Si, MD   No chief complaint on file.    History of Present Illness: Rose Burgess is a 73 y.o. female with hypertension, mild aortic stenosis, anxiety, prior GI bleed and GERD who presents for follow-up.  She was initially seen 02/2017 for management of hypertension and evaluation of a murmur. Rose Burgess saw Dr. Barbaraann Barthel on 01/17/17 and reportedly difficult to manage hypertension. Her blood pressure at home was running in the 170s to 180s systolic.  Metoprolol was switched to carvedilol.  She was noted to have a systolic murmur and was referred for an echo 03/05/17 that revealed LVEF 60-65% with mild aortic stenosis and mild mitral regurgitation.  Mean aortic valve gradient was 14 mmHg. In her echo 02/2018 the mean gradient was 11 mmHg.  Amlodipine was added due to poorly controlled hypertension.    Since her last appointment Rose Burgess has been well.  She hasn't been exercising because of COVID-19.  She doesn't go to the gym.  She started feeling depressed and sleeping all the time.  She started on buproprion in October and thinks this is helping.  She checks her BP at home and it has been ranging 100s-130/50-60s.  She has no chest pain or shortness of breath.  She denies lower extremity edema, orthopnea, or PND.  She gets tired if she exerts herself a lot but she thinks this is due to decreased physical activity.   Past Medical History:  Diagnosis Date  . Acid reflux   . Arthritis   . Diverticulosis   . GERD (gastroesophageal reflux disease)   . H/O hiatal hernia   . Heart murmur   . History of blood transfusion    07/2012 - several blood transfusions per patient   . History of primary hypertension   . Hypertension   . Hyponatremia    h/o hyponatremia on diuretics  . Mild aortic stenosis 03/29/2017   Mean gradient 14 mmHg  on echo 02/2017.  Marland Kitchen Panic attack     Past Surgical History:  Procedure Laterality Date  . BUNIONECTOMY    . CATARACT EXTRACTION Right 2018  . colon surgery for devierticulosis   2013   [address] [place], [place]  . 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 TOTAL HIP ARTHROPLASTY Left 05/11/2014   Procedure: LEFT TOTAL HIP ARTHROPLASTY ANTERIOR APPROACH;  Surgeon: Shelda Pal, MD;  Location: [facility];  Service: Orthopedics;  Laterality: Left;  . TUBAL LIGATION       Current Outpatient Medications  Medication Sig Dispense Refill  . amLODipine (NORVASC) 10 MG tablet TAKE 1 TABLET BY MOUTH EVERY DAY 90 tablet 2  . BIOTIN 5000 PO Take 1 capsule by mouth every morning.     Marland Kitchen buPROPion (WELLBUTRIN XL) 150 MG 24 hr tablet Take 150 mg by mouth every morning.    . Calcium Carbonate-Vitamin D (CALCIUM HIGH POTENCY/VITAMIN D) 600-200 MG-UNIT TABS Take 1 tablet 2 (two) times daily by mouth.     . carvedilol (COREG) 25 MG tablet TAKE 1 TABLET BY MOUTH TWICE A DAY 180 tablet 3  . Coenzyme Q10 (EQL COQ10) 300 MG CAPS Take by mouth daily.    Marland Kitchen  fluticasone (FLONASE) 50 MCG/ACT nasal spray Place 2 sprays as needed into both nostrils for allergies.   5  . lisinopril (PRINIVIL,ZESTRIL) 20 MG tablet Take 20 mg by mouth 2 (two) times daily.    Marland Kitchen. LORazepam (ATIVAN) 0.5 MG tablet Take 1/2 to one tablet twice a day as needed for anxiety    . Multiple Vitamin (MULTIVITAMIN WITH MINERALS) TABS tablet Take 1 tablet by mouth every morning.     . Omega 3 1200 MG CAPS Take 2 capsules by mouth every morning.     . ranitidine (ZANTAC) 150 MG tablet Take 150 mg by mouth as needed for heartburn.    . TURMERIC PO Take 1 capsule daily by mouth. PATIENT NOT SURE OF DOSAGE    . vitamin B-12 (CYANOCOBALAMIN) 500 MCG tablet Take 500 mcg by mouth every morning.     Marland Kitchen. VITAMIN E PO  Take 1 capsule daily by mouth. PATIENT NOT SURE OF DOSAGE     No current facility-administered medications for this visit.     Allergies:   Patient has no known allergies.    Social History:  The patient  reports that she has never smoked. She has never used smokeless tobacco. She reports that she does not drink alcohol or use drugs.   Family History:  The patient's family history includes Colon cancer in her mother; Mitral valve prolapse in her father, paternal aunt, and sister.    ROS:  Please see the history of present illness.   Otherwise, review of systems are positive for none.   All other systems are reviewed and negative.    PHYSICAL EXAM: VS:  BP (!) 142/60   Pulse 64   Ht 5' (1.524 m)   Wt 158 lb (71.7 kg)   SpO2 97%   BMI 30.86 kg/m  , BMI Body mass index is 30.86 kg/m. GENERAL:  Well appearing HEENT: Pupils equal round and reactive, fundi not visualized, oral mucosa unremarkable NECK:  No jugular venous distention, waveform within normal limits, carotid upstroke brisk and symmetric, no bruits LUNGS:  Clear to auscultation bilaterally HEART:  RRR.  PMI not displaced or sustained,S1 and S2 within normal limits, no S3, no S4, no clicks, no rubs, II/VI early peaking systolic murmur at LUSB ABD:  Flat, positive bowel sounds normal in frequency in pitch, no bruits, no rebound, no guarding, no midline pulsatile mass, no hepatomegaly, no splenomegaly EXT:  2 plus pulses throughout, no edema, no cyanosis no clubbing SKIN:  No rashes no nodules NEURO:  Cranial nerves II through XII grossly intact, motor grossly intact throughout PSYCH:  Cognitively intact, oriented to person place and time   EKG:  EKG is ordered today. The ekg ordered 02/19/17 demonstrates sinus rhythm. Rate 66 bpm. Occasional PVCs. 11/15/17: Sinus bradycardia.  Rate 54 bpm.   04/25/19: Sinus rhythm.  Rate 64 bpm.  Echo 03/11/18: Study Conclusions  - Left ventricle: The cavity size was normal. Systolic  function was   normal. The estimated ejection fraction was in the range of 60%   to 65%. Wall motion was normal; there were no regional wall   motion abnormalities. Features are consistent with a pseudonormal   left ventricular filling pattern, with concomitant abnormal   relaxation and increased filling pressure (grade 2 diastolic   dysfunction). Doppler parameters are consistent with   indeterminate ventricular filling pressure. - Aortic valve: Valve mobility was restricted. There was mild   stenosis. There was mild regurgitation. Peak velocity (S): 232  cm/s. Mean gradient (S): 11 mm Hg. Peak gradient (S): 22 mm Hg.   Regurgitation pressure half-time: 545 ms. - Mitral valve: Transvalvular velocity was within the normal range.   There was no evidence for stenosis. There was mild regurgitation. - Left atrium: The atrium was mildly dilated. - Right ventricle: The cavity size was normal. Wall thickness was   normal. Systolic function was normal. - Atrial septum: No defect or patent foramen ovale was identified. - Tricuspid valve: There was mild regurgitation. - Pulmonary arteries: PA peak pressure: 35 mm Hg (S). - Global longitudinal strain -21.9% (normal).   Recent Labs: No results found for requested labs within last 8760 hours.    12/26/16: Total cholesterol 163, triglycerides 96, HDL 65, LDL 79 Sodium 135, potassium 4.5, BUN 13, creatinine 0.7. AST 17, ALT 15  Lipid Panel No results found for: CHOL, TRIG, HDL, CHOLHDL, VLDL, LDLCALC, LDLDIRECT    Wt Readings from Last 3 Encounters:  04/25/19 158 lb (71.7 kg)  11/15/17 160 lb 6.4 oz (72.8 kg)  06/18/17 157 lb 3.2 oz (71.3 kg)      ASSESSMENT AND PLAN:  # Mild aortic stenosis:  Mean gradient 11 mmHg on echo 02/2018.  Repeat 02/2020.  She remains asymptomatic.  # Hypertension:  Blood pressure elevated here but controlled at home.  She brought her machine and it was accurate when checked with our pharmacist.  She has  whitecoat hypertension and gets anxious when she comes to the doctor.  Continue amlodipine, carvedilol, and lisinopril.    Current medicines are reviewed at length with the patient today.  The patient does not have concerns regarding medicines.  The following changes have been made:  Switch metoprolol to carvedilol  Labs/ tests ordered today include:   No orders of the defined types were placed in this encounter.   Disposition:   FU with Heyli Min C. Oval Linsey, MD, [facility] in 1 year.    Signed, Amya Hlad C. Oval Linsey, MD, [facility]  04/25/2019 4:12 PM    [place]

## 2019-07-04 ENCOUNTER — Other Ambulatory Visit: Payer: Self-pay | Admitting: Cardiovascular Disease

## 2019-11-30 ENCOUNTER — Other Ambulatory Visit: Payer: Self-pay | Admitting: Cardiovascular Disease

## 2020-02-03 DIAGNOSIS — M810 Age-related osteoporosis without current pathological fracture: Secondary | ICD-10-CM | POA: Diagnosis not present

## 2020-04-06 DIAGNOSIS — M810 Age-related osteoporosis without current pathological fracture: Secondary | ICD-10-CM | POA: Diagnosis not present

## 2020-04-06 DIAGNOSIS — Z1159 Encounter for screening for other viral diseases: Secondary | ICD-10-CM | POA: Diagnosis not present

## 2020-04-06 DIAGNOSIS — F339 Major depressive disorder, recurrent, unspecified: Secondary | ICD-10-CM | POA: Diagnosis not present

## 2020-04-06 DIAGNOSIS — I10 Essential (primary) hypertension: Secondary | ICD-10-CM | POA: Diagnosis not present

## 2020-04-06 DIAGNOSIS — Z0000 Encounter for general adult medical examination without abnormal findings: Secondary | ICD-10-CM | POA: Diagnosis not present

## 2020-04-28 ENCOUNTER — Other Ambulatory Visit: Payer: Self-pay

## 2020-04-28 ENCOUNTER — Ambulatory Visit (HOSPITAL_COMMUNITY): Payer: Medicare PPO | Attending: Internal Medicine

## 2020-04-28 DIAGNOSIS — I350 Nonrheumatic aortic (valve) stenosis: Secondary | ICD-10-CM

## 2020-04-28 LAB — ECHOCARDIOGRAM COMPLETE
AR max vel: 1.33 cm2
AV Area VTI: 1.22 cm2
AV Area mean vel: 1.19 cm2
AV Mean grad: 15.5 mmHg
AV Peak grad: 23.2 mmHg
Ao pk vel: 2.41 m/s
Area-P 1/2: 4.15 cm2
P 1/2 time: 535 msec
S' Lateral: 2.3 cm

## 2020-05-11 ENCOUNTER — Ambulatory Visit: Payer: Medicare PPO | Admitting: Cardiovascular Disease

## 2020-05-11 ENCOUNTER — Other Ambulatory Visit: Payer: Self-pay

## 2020-05-11 ENCOUNTER — Encounter: Payer: Self-pay | Admitting: Cardiovascular Disease

## 2020-05-11 VITALS — BP 146/64 | HR 55 | Ht 60.0 in | Wt 152.6 lb

## 2020-05-11 DIAGNOSIS — I350 Nonrheumatic aortic (valve) stenosis: Secondary | ICD-10-CM | POA: Diagnosis not present

## 2020-05-11 DIAGNOSIS — E871 Hypo-osmolality and hyponatremia: Secondary | ICD-10-CM | POA: Diagnosis not present

## 2020-05-11 DIAGNOSIS — I10 Essential (primary) hypertension: Secondary | ICD-10-CM | POA: Diagnosis not present

## 2020-05-11 NOTE — Patient Instructions (Signed)
Medication Instructions:  Your physician recommends that you continue on your current medications as directed. Please refer to the Current Medication list given to you today.  *If you need a refill on your cardiac medications before your next appointment, please call your pharmacy*  Lab Work: NONE   Testing/Procedures: NONE  Follow-Up: At [facility], you and your health needs are our priority.  As part of our continuing mission to provide you with exceptional heart care, we have created designated Provider Care Teams.  These Care Teams include your primary Cardiologist (physician) and Advanced Practice Providers (APPs -  Physician Assistants and Nurse Practitioners) who all work together to provide you with the care you need, when you need it.  We recommend signing up for the patient portal called "MyChart".  Sign up information is provided on this After Visit Summary.  MyChart is used to connect with patients for Virtual Visits (Telemedicine).  Patients are able to view lab/test results, encounter notes, upcoming appointments, etc.  Non-urgent messages can be sent to your provider as well.   To learn more about what you can do with MyChart, go to [email or web address].    Your next appointment:   6 month(s)  The format for your next appointment:   In Person  Provider:   You may see DR [facility] or one of the following Advanced Practice Providers on your designated Care Team:    Corine Shelter, PA-C  [place], [place]  Edd Fabian, [place]    Other Instructions  WORK ON DIET AND EXERCISE  MONITOR YOU BLOOD PRESSURE AND CALL THE OFFICE IF IT IS NOT CONSISTENTLY BELOW 130/80

## 2020-05-11 NOTE — Progress Notes (Signed)
Cardiology Office Note   Date:  05/11/2020   ID:  Rose Burgess, DOB 05-17-1946, MRN [id number]  PCP:  Clayborn Heron, MD  Cardiologist:   Chilton Si, MD   No chief complaint on file.    History of Present Illness: Rose Burgess is a 74 y.o. female with hypertension, mild aortic stenosis, anxiety, prior GI bleed and GERD who presents for follow-up.  She was initially seen 02/2017 for management of hypertension and evaluation of a murmur. Rose Burgess saw Rose Burgess on 01/17/17 and reportedly difficult to manage hypertension. Her blood pressure at home was running in the 170s to 180s systolic.  Metoprolol was switched to carvedilol.  Amlodipine was added due to poorly controlled hypertension.  She was noted to have a systolic murmur and was referred for an echo 03/05/17 that revealed LVEF 60-65% with mild aortic stenosis and mild mitral regurgitation.  Mean aortic valve gradient was 14 mmHg. In her echo 02/2018 the mean gradient was 11 mmHg.    Since her last appointment Rose Burgess had a repeat echo 04/2020 that revealed normal systolic function and again mild aortic stenosis with a mean gradient of 15 mmHg.  She continues to be stressed about COVID-19.  She has not been going to the gym.  She is active caring for her husband who has back pain and is currently in a wheelchair.  She does not get any formal exercise.  At home her BP has been pretty well controlled.  For the most part it has been well-controlled.  She has not had any chest pain or edema.  She does get short of breath going up the stairs at times.  She notes that she is also been struggling with anxiety.  She stopped taking lorazepam before she finished the bottle because she was concerned about addiction.  Her PCP started her on bupropion and since she started at a higher dose this seems to be helping.  She does wonder if anxiety is contributing to her blood pressure running higher.  She is working on her diet and knows  that she needs to exercise more.  Once her husband is better as they plan to start walking again together.  She notes that her sister had an open AVR at age 75.   Past Medical History:  Diagnosis Date  . Acid reflux   . Arthritis   . Diverticulosis   . GERD (gastroesophageal reflux disease)   . H/O hiatal hernia   . Heart murmur   . History of blood transfusion    07/2012 - several blood transfusions per patient   . History of primary hypertension   . Hypertension   . Hyponatremia    h/o hyponatremia on diuretics  . Mild aortic stenosis 03/29/2017   Mean gradient 14 mmHg on echo 02/2017.  Marland Kitchen Panic attack     Past Surgical History:  Procedure Laterality Date  . BUNIONECTOMY    . CATARACT EXTRACTION Right 2018  . colon surgery for devierticulosis   2013   3'14 [place], [place]  . 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 TOTAL HIP ARTHROPLASTY Left 05/11/2014   Procedure: LEFT TOTAL HIP ARTHROPLASTY ANTERIOR APPROACH;  Surgeon: Shelda Pal, MD;  Location: [facility];  Service: Orthopedics;  Laterality: Left;  . TUBAL LIGATION  Current Outpatient Medications  Medication Sig Dispense Refill  . alendronate (FOSAMAX) 70 MG tablet 1 tablet 30 minutes before the first food, beverage or medicine of the day with plain water    . amLODipine (NORVASC) 10 MG tablet TAKE 1 TABLET BY MOUTH EVERY DAY 90 tablet 2  . BIOTIN 5000 PO Take 1 capsule by mouth every morning.     Marland Kitchen buPROPion (WELLBUTRIN XL) 300 MG 24 hr tablet 1 tablet in the morning    . Calcium Carbonate-Vitamin D 600-200 MG-UNIT TABS Take 1 tablet 2 (two) times daily by mouth.     . carvedilol (COREG) 25 MG tablet TAKE 1 TABLET BY MOUTH TWICE A DAY 180 tablet 3  . Coenzyme Q10 300 MG CAPS Take by mouth daily.    . cycloSPORINE (RESTASIS) 0.05 % ophthalmic emulsion 1 drop in both  eyes    . fluticasone (FLONASE) 50 MCG/ACT nasal spray Place 2 sprays as needed into both nostrils for allergies.   5  . lisinopril (PRINIVIL,ZESTRIL) 20 MG tablet Take 20 mg by mouth 2 (two) times daily.    . Multiple Vitamin (MULTIVITAMIN WITH MINERALS) TABS tablet Take 1 tablet by mouth every morning.     . Omega 3 1200 MG CAPS Take 2 capsules by mouth every morning.     . ranitidine (ZANTAC) 150 MG tablet Take 150 mg by mouth as needed for heartburn.    . TURMERIC PO Take 1 capsule by mouth daily. PATIENT NOT SURE OF DOSAGE    . vitamin B-12 (CYANOCOBALAMIN) 500 MCG tablet Take 500 mcg by mouth every morning.     Marland Kitchen VITAMIN E PO Take 1 capsule by mouth daily. PATIENT NOT SURE OF DOSAGE     No current facility-administered medications for this visit.    Allergies:   Patient has no known allergies.    Social History:  The patient  reports that she has never smoked. She has never used smokeless tobacco. She reports that she does not drink alcohol and does not use drugs.   Family History:  The patient's family history includes Colon cancer in her mother; Mitral valve prolapse in her father, paternal aunt, and sister.    ROS:  Please see the history of present illness.   Otherwise, review of systems are positive for none.   All other systems are reviewed and negative.    PHYSICAL EXAM: VS:  BP (!) 146/64   Pulse (!) 55   Ht 5' (1.524 m)   Wt 152 lb 9.6 oz (69.2 kg)   BMI 29.80 kg/m  , BMI Body mass index is 29.8 kg/m. GENERAL:  Well appearing HEENT: Pupils equal round and reactive, fundi not visualized, oral mucosa unremarkable NECK:  No jugular venous distention, waveform within normal limits, carotid upstroke brisk and symmetric, no bruits LUNGS:  Clear to auscultation bilaterally HEART:  RRR.  PMI not displaced or sustained,S1 and S2 within normal limits, no S3, no S4, no clicks, no rubs, II/VI early peaking systolic murmur at LUSB ABD:  Flat, positive bowel sounds normal in  frequency in pitch, no bruits, no rebound, no guarding, no midline pulsatile mass, no hepatomegaly, no splenomegaly EXT:  2 plus pulses throughout, no edema, no cyanosis no clubbing SKIN:  No rashes no nodules NEURO:  Cranial nerves II through XII grossly intact, motor grossly intact throughout PSYCH:  Cognitively intact, oriented to person place and time   EKG:  EKG is ordered today. The ekg ordered 02/19/17 demonstrates sinus rhythm.  Rate 66 bpm. Occasional PVCs. 11/15/17: Sinus bradycardia.  Rate 54 bpm.   04/25/19: Sinus rhythm.  Rate 64 bpm. 05/11/2020: Sinus bradycardia.  Rate 55 bpm.  Cannot rule out prior septal infarct.  Echo 03/11/18: Study Conclusions  - Left ventricle: The cavity size was normal. Systolic function was   normal. The estimated ejection fraction was in the range of 60%   to 65%. Wall motion was normal; there were no regional wall   motion abnormalities. Features are consistent with a pseudonormal   left ventricular filling pattern, with concomitant abnormal   relaxation and increased filling pressure (grade 2 diastolic   dysfunction). Doppler parameters are consistent with   indeterminate ventricular filling pressure. - Aortic valve: Valve mobility was restricted. There was mild   stenosis. There was mild regurgitation. Peak velocity (S): 232   cm/s. Mean gradient (S): 11 mm Hg. Peak gradient (S): 22 mm Hg.   Regurgitation pressure half-time: 545 ms. - Mitral valve: Transvalvular velocity was within the normal range.   There was no evidence for stenosis. There was mild regurgitation. - Left atrium: The atrium was mildly dilated. - Right ventricle: The cavity size was normal. Wall thickness was   normal. Systolic function was normal. - Atrial septum: No defect or patent foramen ovale was identified. - Tricuspid valve: There was mild regurgitation. - Pulmonary arteries: PA peak pressure: 35 mm Hg (S). - Global longitudinal strain -21.9% (normal).  Echo  04/2020:  1. Left ventricular ejection fraction, by estimation, is 60 to 65%. The  left ventricle has normal function. The left ventricle has no regional  wall motion abnormalities. There is mild asymmetric left ventricular  hypertrophy of the basal-septal segment.  Left ventricular diastolic parameters were normal.  2. Right ventricular systolic function is normal. The right ventricular  size is normal. There is normal pulmonary artery systolic pressure. The  estimated right ventricular systolic pressure is 30.5 mmHg.  3. The mitral valve is grossly normal. Mild mitral valve regurgitation.  No evidence of mitral stenosis. Moderate mitral annular calcification.  4. Tricuspid valve regurgitation is mild to moderate.  5. The aortic valve is abnormal. There is moderate calcification of the  aortic valve. Aortic valve regurgitation is mild. Mild aortic valve  stenosis. Aortic valve mean gradient measures 15.5 mmHg.  6. The inferior vena cava is normal in size with greater than 50%  respiratory variability, suggesting right atrial pressure of 3 mmHg.  Recent Labs: No results found for requested labs within last 8760 hours.    12/26/16: Total cholesterol 163, triglycerides 96, HDL 65, LDL 79 Sodium 135, potassium 4.5, BUN 13, creatinine 0.7. AST 17, ALT 15  Lipid Panel No results found for: CHOL, TRIG, HDL, CHOLHDL, VLDL, LDLCALC, LDLDIRECT    Wt Readings from Last 3 Encounters:  05/11/20 152 lb 9.6 oz (69.2 kg)  04/25/19 158 lb (71.7 kg)  11/15/17 160 lb 6.4 oz (72.8 kg)      ASSESSMENT AND PLAN:  # Mild aortic stenosis:  Mean gradient 15 mmHg.  Repeat echo 04/2022.  She is stable and asymptomatic.  # Hypertension:  Blood pressure elevated here but controlled at home.  She brought her machine and it was accurate when checked with our pharmacist.  She has whitecoat hypertension and gets anxious when she comes to the doctor.  Continue amlodipine, carvedilol, and lisinopril.   She will keep tracking it at home.   Current medicines are reviewed at length with the patient today.  The patient does  not have concerns regarding medicines.  The following changes have been made: None  Labs/ tests ordered today include:   Orders Placed This Encounter  Procedures  . EKG 12-Lead    Disposition:   FU with Carleton [place] C. Duke Salvia, MD, [facility] in 6 months    Signed, Marcus Schwandt C. Duke Salvia, MD, [facility]  05/11/2020 5:04 PM     [facility]

## 2020-05-25 DIAGNOSIS — S39012A Strain of muscle, fascia and tendon of lower back, initial encounter: Secondary | ICD-10-CM | POA: Diagnosis not present

## 2020-06-03 DIAGNOSIS — S80812A Abrasion, left lower leg, initial encounter: Secondary | ICD-10-CM | POA: Diagnosis not present

## 2020-06-03 DIAGNOSIS — R600 Localized edema: Secondary | ICD-10-CM | POA: Diagnosis not present

## 2020-06-03 DIAGNOSIS — W108XXA Fall (on) (from) other stairs and steps, initial encounter: Secondary | ICD-10-CM | POA: Diagnosis not present

## 2020-06-03 DIAGNOSIS — M25552 Pain in left hip: Secondary | ICD-10-CM | POA: Diagnosis not present

## 2020-06-10 DIAGNOSIS — T148XXA Other injury of unspecified body region, initial encounter: Secondary | ICD-10-CM | POA: Diagnosis not present

## 2020-06-10 DIAGNOSIS — L03116 Cellulitis of left lower limb: Secondary | ICD-10-CM | POA: Diagnosis not present

## 2020-06-15 DIAGNOSIS — F3341 Major depressive disorder, recurrent, in partial remission: Secondary | ICD-10-CM | POA: Diagnosis not present

## 2020-07-08 ENCOUNTER — Other Ambulatory Visit: Payer: Self-pay | Admitting: Cardiovascular Disease

## 2020-08-24 DIAGNOSIS — Z6830 Body mass index (BMI) 30.0-30.9, adult: Secondary | ICD-10-CM | POA: Diagnosis not present

## 2020-08-24 DIAGNOSIS — Z01419 Encounter for gynecological examination (general) (routine) without abnormal findings: Secondary | ICD-10-CM | POA: Diagnosis not present

## 2020-08-25 DIAGNOSIS — H10411 Chronic giant papillary conjunctivitis, right eye: Secondary | ICD-10-CM | POA: Diagnosis not present

## 2020-09-14 DIAGNOSIS — Z1231 Encounter for screening mammogram for malignant neoplasm of breast: Secondary | ICD-10-CM | POA: Diagnosis not present

## 2020-10-11 DIAGNOSIS — H524 Presbyopia: Secondary | ICD-10-CM | POA: Diagnosis not present

## 2020-10-11 DIAGNOSIS — H26493 Other secondary cataract, bilateral: Secondary | ICD-10-CM | POA: Diagnosis not present

## 2020-10-11 DIAGNOSIS — H04123 Dry eye syndrome of bilateral lacrimal glands: Secondary | ICD-10-CM | POA: Diagnosis not present

## 2020-11-11 ENCOUNTER — Ambulatory Visit: Payer: Medicare PPO | Admitting: Cardiovascular Disease

## 2021-01-06 ENCOUNTER — Encounter (HOSPITAL_BASED_OUTPATIENT_CLINIC_OR_DEPARTMENT_OTHER): Payer: Self-pay | Admitting: Cardiovascular Disease

## 2021-01-06 ENCOUNTER — Ambulatory Visit (HOSPITAL_BASED_OUTPATIENT_CLINIC_OR_DEPARTMENT_OTHER): Payer: Medicare PPO | Admitting: Cardiovascular Disease

## 2021-01-06 ENCOUNTER — Other Ambulatory Visit: Payer: Self-pay

## 2021-01-06 VITALS — BP 132/70 | HR 54 | Ht 60.0 in | Wt 153.2 lb

## 2021-01-06 DIAGNOSIS — I10 Essential (primary) hypertension: Secondary | ICD-10-CM

## 2021-01-06 DIAGNOSIS — I350 Nonrheumatic aortic (valve) stenosis: Secondary | ICD-10-CM

## 2021-01-06 NOTE — Progress Notes (Signed)
Cardiology Office Note   Date:  01/06/2021   ID:  Rose EmBonnie B Mesina, DOB 11/09/1945, MRN [id number]  PCP:  Clayborn Heronankins, Victoria R, MD  Cardiologist:   Chilton Siiffany Gildford, MD   No chief complaint on file.    History of Present Illness: Rose Burgess is a 75 y.o. female with hypertension, mild aortic stenosis, anxiety, prior GI bleed and GERD who presents for follow-up.  She was initially seen 02/2017 for management of hypertension and evaluation of a murmur. Ms. Blenda NicelyKetner saw Dr. Barbaraann Barthelankins on 01/17/17 and reportedly difficult to manage hypertension. Her blood pressure at home was running in the 170s to 180s systolic.  Metoprolol was switched to carvedilol.  Amlodipine was added due to poorly controlled hypertension.  She was noted to have a systolic murmur and was referred for an echo 03/05/17 that revealed LVEF 60-65% with mild aortic stenosis and mild mitral regurgitation.  Mean aortic valve gradient was 14 mmHg. In her echo 02/2018 the mean gradient was 11 mmHg.    Ms. Blenda NicelyKetner had a repeat echo 04/2020 that revealed normal systolic function and again mild aortic stenosis with a mean gradient of 15 mmHg.  She noted that she was struggling with anxiety.  She stopped taking lorazepam before she finished the bottle because she was concerned about addiction.  Her PCP started her on bupropion and since she started at a higher dose this seems to be helping.  Today, she reports feeling about the same overall. At home her blood pressure has been more controlled than at clinic visits. About once a month she has an episode of lightheadedness. She will sit down and drink some fluid. The episodes dissipate shortly. Since November her husband has had two back surgeries, and caring for him has limited her formal exercise. Lately she has started to walk more often, and still mows the lawn. She feels renewed while exercising. For her diet she normally cooks her meals. She takes a calcium supplement, but needs to drink 4-5  glasses of water and eat one hour after taking it. She denies any palpitations, chest pain, or shortness of breath. No headaches, syncope, orthopnea, or PND. Also has no lower extremity edema.   Past Medical History:  Diagnosis Date   Acid reflux    Arthritis    Diverticulosis    Essential hypertension 10/17/2015   GERD (gastroesophageal reflux disease)    H/O hiatal hernia    Heart murmur    History of blood transfusion    07/2012 - several blood transfusions per patient    History of primary hypertension    Hypertension    Hyponatremia    h/o hyponatremia on diuretics   Mild aortic stenosis 03/29/2017   Mean gradient 14 mmHg on echo 02/2017.   Panic attack     Past Surgical History:  Procedure Laterality Date   BUNIONECTOMY     CATARACT EXTRACTION Right 2018   colon surgery for devierticulosis   2013   3'14 [place]   TOTAL HIP ARTHROPLASTY Right 09/30/2013   Procedure: RIGHT TOTAL HIP ARTHROPLASTY ANTERIOR APPROACH;  Surgeon: Shelda PalMatthew D Olin, MD;  Location: [facility];  Service: Orthopedics;  Laterality: Right;   TOTAL HIP ARTHROPLASTY Left 05/11/2014   Procedure: LEFT TOTAL HIP ARTHROPLASTY ANTERIOR APPROACH;  Surgeon: Shelda PalMatthew D Olin, MD;  Location: [facility];  Service: Orthopedics;  Laterality: Left;   TUBAL LIGATION      Current Outpatient Medications  Medication Sig Dispense Refill   alendronate (FOSAMAX) 70  MG tablet 1 tablet 30 minutes before the first food, beverage or medicine of the day with plain water     amLODipine (NORVASC) 10 MG tablet TAKE 1 TABLET BY MOUTH EVERY DAY 90 tablet 3   b complex vitamins capsule Take 1 capsule by mouth daily.     BIOTIN 5000 PO Take 1 capsule by mouth every morning.      buPROPion (WELLBUTRIN XL) 300 MG 24 hr tablet 1 tablet in the morning     Calcium Carbonate-Vitamin D 600-200 MG-UNIT TABS Take 1 tablet 2 (two) times daily by mouth.      carvedilol (COREG) 25 MG tablet TAKE 1 TABLET BY MOUTH TWICE A DAY 180 tablet 3   Coenzyme Q10  300 MG CAPS Take by mouth daily.     cycloSPORINE (RESTASIS) 0.05 % ophthalmic emulsion 1 drop in both eyes     fluticasone (FLONASE) 50 MCG/ACT nasal spray Place 2 sprays as needed into both nostrils for allergies.   5   lisinopril (PRINIVIL,ZESTRIL) 20 MG tablet Take 20 mg by mouth 2 (two) times daily.     Multiple Vitamin (MULTIVITAMIN WITH MINERALS) TABS tablet Take 1 tablet by mouth every morning.      Omega 3 1200 MG CAPS Take 2 capsules by mouth every morning.      ranitidine (ZANTAC) 150 MG tablet Take 150 mg by mouth as needed for heartburn.     TURMERIC PO Take 1 capsule by mouth daily. PATIENT NOT SURE OF DOSAGE     vitamin B-12 (CYANOCOBALAMIN) 500 MCG tablet Take 500 mcg by mouth every morning.      VITAMIN E PO Take 1 capsule by mouth daily. PATIENT NOT SURE OF DOSAGE     No current facility-administered medications for this visit.    Allergies:   Patient has no known allergies.   Social History:  The patient  reports that she has never smoked. She has never used smokeless tobacco. She reports that she does not drink alcohol and does not use drugs.   Family History:  The patient's family history includes Colon cancer in her mother; Mitral valve prolapse in her father, paternal aunt, and sister.    ROS:   Please see the history of present illness. (+) Lightheadedness All other systems are reviewed and negative.    PHYSICAL EXAM: VS:  BP 132/70   Pulse (!) 54   Ht 5' (1.524 m)   Wt 153 lb 3.2 oz (69.5 kg)   BMI 29.92 kg/m  , BMI Body mass index is 29.92 kg/m. GENERAL:  Well appearing HEENT: Pupils equal round and reactive, fundi not visualized, oral mucosa unremarkable NECK:  No jugular venous distention, waveform within normal limits, carotid upstroke brisk and symmetric, no bruits LUNGS:  Clear to auscultation bilaterally HEART:  RRR.  PMI not displaced or sustained,S1 and S2 within normal limits, no S3, no S4, no clicks, no rubs, II/VI early peaking systolic  murmur at LUSB ABD:  Flat, positive bowel sounds normal in frequency in pitch, no bruits, no rebound, no guarding, no midline pulsatile mass, no hepatomegaly, no splenomegaly EXT:  2 plus pulses throughout, no edema, no cyanosis no clubbing SKIN:  No rashes no nodules NEURO:  Cranial nerves II through XII grossly intact, motor grossly intact throughout PSYCH:  Cognitively intact, oriented to person place and time   EKG:  01/06/2021: Sinus bradycardia. Rate 54 bpm. 05/11/2020: Sinus bradycardia.  Rate 55 bpm.  Cannot rule out prior septal infarct. 04/25/19:  Sinus rhythm.  Rate 64 bpm. 11/15/17: Sinus bradycardia.  Rate 54 bpm.   02/19/17: sinus rhythm. Rate 66 bpm. Occasional PVCs.   Echo 04/28/2020:  1. Left ventricular ejection fraction, by estimation, is 60 to 65%. The  left ventricle has normal function. The left ventricle has no regional  wall motion abnormalities. There is mild asymmetric left ventricular  hypertrophy of the basal-septal segment.  Left ventricular diastolic parameters were normal.   2. Right ventricular systolic function is normal. The right ventricular  size is normal. There is normal pulmonary artery systolic pressure. The  estimated right ventricular systolic pressure is 30.5 mmHg.   3. The mitral valve is grossly normal. Mild mitral valve regurgitation.  No evidence of mitral stenosis. Moderate mitral annular calcification.   4. Tricuspid valve regurgitation is mild to moderate.   5. The aortic valve is abnormal. There is moderate calcification of the  aortic valve. Aortic valve regurgitation is mild. Mild aortic valve  stenosis. Aortic valve mean gradient measures 15.5 mmHg.   6. The inferior vena cava is normal in size with greater than 50%  respiratory variability, suggesting right atrial pressure of 3 mmHg.   Echo 03/11/18: Study Conclusions   - Left ventricle: The cavity size was normal. Systolic function was   normal. The estimated ejection fraction  was in the range of 60%   to 65%. Wall motion was normal; there were no regional wall   motion abnormalities. Features are consistent with a pseudonormal   left ventricular filling pattern, with concomitant abnormal   relaxation and increased filling pressure (grade 2 diastolic   dysfunction). Doppler parameters are consistent with   indeterminate ventricular filling pressure. - Aortic valve: Valve mobility was restricted. There was mild   stenosis. There was mild regurgitation. Peak velocity (S): 232   cm/s. Mean gradient (S): 11 mm Hg. Peak gradient (S): 22 mm Hg.   Regurgitation pressure half-time: 545 ms. - Mitral valve: Transvalvular velocity was within the normal range.   There was no evidence for stenosis. There was mild regurgitation. - Left atrium: The atrium was mildly dilated. - Right ventricle: The cavity size was normal. Wall thickness was   normal. Systolic function was normal. - Atrial septum: No defect or patent foramen ovale was identified. - Tricuspid valve: There was mild regurgitation. - Pulmonary arteries: PA peak pressure: 35 mm Hg (S). - Global longitudinal strain -21.9% (normal).  Recent Labs: No results found for requested labs within last 8760 hours.    12/26/16: Total cholesterol 163, triglycerides 96, HDL 65, LDL 79 Sodium 135, potassium 4.5, BUN 13, creatinine 0.7. AST 17, ALT 15  Lipid Panel No results found for: CHOL, TRIG, HDL, CHOLHDL, VLDL, LDLCALC, LDLDIRECT    Wt Readings from Last 3 Encounters:  01/06/21 153 lb 3.2 oz (69.5 kg)  05/11/20 152 lb 9.6 oz (69.2 kg)  04/25/19 158 lb (71.7 kg)      ASSESSMENT AND PLAN: Essential hypertension Blood pressures well have been controlled both in the office and at home.  Continue amlodipine, lisinopril, and carvedilol.  She is going to work on increasing her exercise.  Mild aortic stenosis She has a history of mild aortic stenosis.  It still sounds mild on exam.  She has no exertional symptoms or  heart failure symptoms.  Repeat echo in 6 months.    Current medicines are reviewed at length with the patient today.  The patient does not have concerns regarding medicines.  The following changes have been  made: None  Labs/ tests ordered today include:   Orders Placed This Encounter  Procedures   EKG 12-Lead   ECHOCARDIOGRAM COMPLETE     Disposition:    FU with [place] C. Duke Salvia, MD, [facility] in 6 months following repeat Echo.  I,Mathew Stumpf,acting as a Neurosurgeon for Chilton Si, MD.,have documented all relevant documentation on the behalf of Chilton Si, MD,as directed by  Chilton Si, MD while in the presence of Chilton Si, MD.  I, Zayanna Pundt C. Duke Salvia, MD have reviewed all documentation for this visit.  The documentation of the exam, diagnosis, procedures, and orders on 01/06/2021 are all accurate and complete.   Signed, Bryer Cozzolino C. Duke Salvia, MD, [facility]  01/06/2021 6:15 PM    [facility]

## 2021-01-06 NOTE — Patient Instructions (Signed)
Medication Instructions:  Continue current medications  *If you need a refill on your cardiac medications before your next appointment, please call your pharmacy*   Lab Work: None Ordered   Testing/Procedures: Your physician has requested that you have an echocardiogram in 6 Months. Echocardiography is a painless test that uses sound waves to create images of your heart. It provides your doctor with information about the size and shape of your heart and how well your heart's chambers and valves are working. This procedure takes approximately one hour. There are no restrictions for this procedure.   Follow-Up: At [facility], you and your health needs are our priority.  As part of our continuing mission to provide you with exceptional heart care, we have created designated Provider Care Teams.  These Care Teams include your primary Cardiologist (physician) and Advanced Practice Providers (APPs -  Physician Assistants and Nurse Practitioners) who all work together to provide you with the care you need, when you need it.  We recommend signing up for the patient portal called "MyChart".  Sign up information is provided on this After Visit Summary.  MyChart is used to connect with patients for Virtual Visits (Telemedicine).  Patients are able to view lab/test results, encounter notes, upcoming appointments, etc.  Non-urgent messages can be sent to your provider as well.   To learn more about what you can do with MyChart, go to [email or web address].    Your next appointment:   6 month(s)  The format for your next appointment:   In Person  Provider:   Chilton Si, MD   Other Instructions Echocardiogram An echocardiogram is a test that uses sound waves (ultrasound) to produce images of the heart. Images from an echocardiogram can provide important information about: Heart size and shape. The size and thickness and movement of your heart's walls. Heart muscle function and  strength. Heart valve function or if you have stenosis. Stenosis is when the heart valves are too narrow. If blood is flowing backward through the heart valves (regurgitation). A tumor or infectious growth around the heart valves. Areas of heart muscle that are not working well because of poor blood flow or injury from a heart attack. Aneurysm detection. An aneurysm is a weak or damaged part of an artery wall. The wall bulges out from the normal force of blood pumping through the body. Tell a health care provider about: Any allergies you have. All medicines you are taking, including vitamins, herbs, eye drops, creams, and over-the-counter medicines. Any blood disorders you have. Any surgeries you have had. Any medical conditions you have. Whether you are pregnant or may be pregnant. What are the risks? Generally, this is a safe test. However, problems may occur, including an allergic reaction to dye (contrast) that may be used during the test. What happens before the test? No specific preparation is needed. You may eat and drink normally. What happens during the test?  You will take off your clothes from the waist up and put on a hospital gown. Electrodes or electrocardiogram (ECG)patches may be placed on your chest. The electrodes or patches are then connected to a device that monitors your heart rate and rhythm. You will lie down on a table for an ultrasound exam. A gel will be applied to your chest to help sound waves pass through your skin. A handheld device, called a transducer, will be pressed against your chest and moved over your heart. The transducer produces sound waves that travel to your heart and bounce  back (or "echo" back) to the transducer. These sound waves will be captured in real-time and changed into images of your heart that can be viewed on a video monitor. The images will be recorded on a computer and reviewed by your health care provider. You may be asked to change  positions or hold your breath for a short time. This makes it easier to get different views or better views of your heart. In some cases, you may receive contrast through an IV in one of your veins. This can improve the quality of the pictures from your heart. The procedure may vary among health care providers and hospitals. What can I expect after the test? You may return to your normal, everyday life, including diet, activities, andmedicines, unless your health care provider tells you not to do that. Follow these instructions at home: It is up to you to get the results of your test. Ask your health care provider, or the department that is doing the test, when your results will be ready. Keep all follow-up visits. This is important. Summary An echocardiogram is a test that uses sound waves (ultrasound) to produce images of the heart. Images from an echocardiogram can provide important information about the size and shape of your heart, heart muscle function, heart valve function, and other possible heart problems. You do not need to do anything to prepare before this test. You may eat and drink normally. After the echocardiogram is completed, you may return to your normal, everyday life, unless your health care provider tells you not to do that. This information is not intended to replace advice given to you by your health care provider. Make sure you discuss any questions you have with your healthcare provider. Document Revised: 12/30/2019 Document Reviewed: 12/30/2019 Elsevier Patient Education  2022 [employer].

## 2021-01-06 NOTE — Assessment & Plan Note (Addendum)
Blood pressures well have been controlled both in the office and at home.  Continue amlodipine, lisinopril, and carvedilol.  She is going to work on increasing her exercise.

## 2021-01-06 NOTE — Assessment & Plan Note (Signed)
She has a history of mild aortic stenosis.  It still sounds mild on exam.  She has no exertional symptoms or heart failure symptoms.  Repeat echo in 6 months.

## 2021-02-22 DIAGNOSIS — D2271 Melanocytic nevi of right lower limb, including hip: Secondary | ICD-10-CM | POA: Diagnosis not present

## 2021-02-22 DIAGNOSIS — L821 Other seborrheic keratosis: Secondary | ICD-10-CM | POA: Diagnosis not present

## 2021-02-22 DIAGNOSIS — L853 Xerosis cutis: Secondary | ICD-10-CM | POA: Diagnosis not present

## 2021-02-22 DIAGNOSIS — D485 Neoplasm of uncertain behavior of skin: Secondary | ICD-10-CM | POA: Diagnosis not present

## 2021-02-22 DIAGNOSIS — L814 Other melanin hyperpigmentation: Secondary | ICD-10-CM | POA: Diagnosis not present

## 2021-02-22 DIAGNOSIS — D2272 Melanocytic nevi of left lower limb, including hip: Secondary | ICD-10-CM | POA: Diagnosis not present

## 2021-02-22 DIAGNOSIS — Z23 Encounter for immunization: Secondary | ICD-10-CM | POA: Diagnosis not present

## 2021-02-22 DIAGNOSIS — D225 Melanocytic nevi of trunk: Secondary | ICD-10-CM | POA: Diagnosis not present

## 2021-02-22 DIAGNOSIS — D2230 Melanocytic nevi of unspecified part of face: Secondary | ICD-10-CM | POA: Diagnosis not present

## 2021-04-19 DIAGNOSIS — Z0000 Encounter for general adult medical examination without abnormal findings: Secondary | ICD-10-CM | POA: Diagnosis not present

## 2021-04-19 DIAGNOSIS — I350 Nonrheumatic aortic (valve) stenosis: Secondary | ICD-10-CM | POA: Diagnosis not present

## 2021-04-19 DIAGNOSIS — F3341 Major depressive disorder, recurrent, in partial remission: Secondary | ICD-10-CM | POA: Diagnosis not present

## 2021-04-19 DIAGNOSIS — M810 Age-related osteoporosis without current pathological fracture: Secondary | ICD-10-CM | POA: Diagnosis not present

## 2021-04-19 DIAGNOSIS — I10 Essential (primary) hypertension: Secondary | ICD-10-CM | POA: Diagnosis not present

## 2021-04-20 DIAGNOSIS — Z8249 Family history of ischemic heart disease and other diseases of the circulatory system: Secondary | ICD-10-CM | POA: Diagnosis not present

## 2021-04-20 DIAGNOSIS — F3342 Major depressive disorder, recurrent, in full remission: Secondary | ICD-10-CM | POA: Diagnosis not present

## 2021-04-20 DIAGNOSIS — M810 Age-related osteoporosis without current pathological fracture: Secondary | ICD-10-CM | POA: Diagnosis not present

## 2021-04-20 DIAGNOSIS — I10 Essential (primary) hypertension: Secondary | ICD-10-CM | POA: Diagnosis not present

## 2021-04-20 DIAGNOSIS — Z7983 Long term (current) use of bisphosphonates: Secondary | ICD-10-CM | POA: Diagnosis not present

## 2021-04-20 DIAGNOSIS — Z6830 Body mass index (BMI) 30.0-30.9, adult: Secondary | ICD-10-CM | POA: Diagnosis not present

## 2021-04-20 DIAGNOSIS — I739 Peripheral vascular disease, unspecified: Secondary | ICD-10-CM | POA: Diagnosis not present

## 2021-04-20 DIAGNOSIS — H04129 Dry eye syndrome of unspecified lacrimal gland: Secondary | ICD-10-CM | POA: Diagnosis not present

## 2021-04-20 DIAGNOSIS — E669 Obesity, unspecified: Secondary | ICD-10-CM | POA: Diagnosis not present

## 2021-05-14 DIAGNOSIS — S01511A Laceration without foreign body of lip, initial encounter: Secondary | ICD-10-CM | POA: Diagnosis not present

## 2021-05-14 DIAGNOSIS — Z96643 Presence of artificial hip joint, bilateral: Secondary | ICD-10-CM | POA: Diagnosis not present

## 2021-05-14 DIAGNOSIS — S0990XA Unspecified injury of head, initial encounter: Secondary | ICD-10-CM | POA: Diagnosis not present

## 2021-05-14 DIAGNOSIS — R220 Localized swelling, mass and lump, head: Secondary | ICD-10-CM | POA: Diagnosis not present

## 2021-05-14 DIAGNOSIS — S0993XA Unspecified injury of face, initial encounter: Secondary | ICD-10-CM | POA: Diagnosis not present

## 2021-05-14 DIAGNOSIS — Z9851 Tubal ligation status: Secondary | ICD-10-CM | POA: Diagnosis not present

## 2021-05-14 DIAGNOSIS — Z8679 Personal history of other diseases of the circulatory system: Secondary | ICD-10-CM | POA: Diagnosis not present

## 2021-05-14 DIAGNOSIS — Z79899 Other long term (current) drug therapy: Secondary | ICD-10-CM | POA: Diagnosis not present

## 2021-05-14 DIAGNOSIS — N390 Urinary tract infection, site not specified: Secondary | ICD-10-CM | POA: Diagnosis not present

## 2021-05-14 DIAGNOSIS — M542 Cervicalgia: Secondary | ICD-10-CM | POA: Diagnosis not present

## 2021-05-19 DIAGNOSIS — R051 Acute cough: Secondary | ICD-10-CM | POA: Diagnosis not present

## 2021-05-19 DIAGNOSIS — R6883 Chills (without fever): Secondary | ICD-10-CM | POA: Diagnosis not present

## 2021-05-19 DIAGNOSIS — R5383 Other fatigue: Secondary | ICD-10-CM | POA: Diagnosis not present

## 2021-05-19 DIAGNOSIS — U071 COVID-19: Secondary | ICD-10-CM | POA: Diagnosis not present

## 2021-05-20 DIAGNOSIS — W010XXD Fall on same level from slipping, tripping and stumbling without subsequent striking against object, subsequent encounter: Secondary | ICD-10-CM | POA: Diagnosis not present

## 2021-05-20 DIAGNOSIS — R519 Headache, unspecified: Secondary | ICD-10-CM | POA: Diagnosis not present

## 2021-05-20 DIAGNOSIS — S01511D Laceration without foreign body of lip, subsequent encounter: Secondary | ICD-10-CM | POA: Diagnosis not present

## 2021-06-30 ENCOUNTER — Other Ambulatory Visit: Payer: Self-pay

## 2021-06-30 ENCOUNTER — Ambulatory Visit (HOSPITAL_COMMUNITY): Payer: Medicare PPO | Attending: Cardiology

## 2021-06-30 DIAGNOSIS — I350 Nonrheumatic aortic (valve) stenosis: Secondary | ICD-10-CM | POA: Insufficient documentation

## 2021-06-30 LAB — ECHOCARDIOGRAM COMPLETE
AR max vel: 1.47 cm2
AV Area VTI: 1.38 cm2
AV Area mean vel: 1.36 cm2
AV Mean grad: 17 mmHg
AV Peak grad: 30.9 mmHg
Ao pk vel: 2.78 m/s
Area-P 1/2: 2.69 cm2
P 1/2 time: 453 msec
S' Lateral: 1.9 cm

## 2021-07-04 NOTE — Progress Notes (Signed)
Cardiology Office Note  Date:  07/07/2021   ID:  Rose Burgess, DOB Jul 19, 1945, MRN [id number]  PCP:  Clayborn Heron, MD  Cardiologist:   Chilton Si, MD   No chief complaint on file.  History of Present Illness: Rose Burgess is a 76 y.o. female with hypertension, mild aortic stenosis, anxiety, prior GI bleed and GERD who presents for follow-up.  She was initially seen 02/2017 for management of hypertension and evaluation of a murmur. Rose Burgess saw Dr. Barbaraann Barthel on 01/17/17 and reportedly difficult to manage hypertension. Her blood pressure at home was running in the 170s to 180s systolic.  Metoprolol was switched to carvedilol.  Amlodipine was added due to poorly controlled hypertension.  She was noted to have a systolic murmur and was referred for an echo 03/05/17 that revealed LVEF 60-65% with mild aortic stenosis and mild mitral regurgitation.  Mean aortic valve gradient was 14 mmHg. In her echo 02/2018 the mean gradient was 11 mmHg.    Rose Burgess had a repeat echo 04/2020 that revealed normal systolic function and again mild aortic stenosis with a mean gradient of 15 mmHg.  She noted that she was struggling with anxiety.  She stopped taking lorazepam before she finished the bottle because she was concerned about addiction.  Her PCP started her on bupropion and since she started at a higher dose this seems to be helping.    At her last appointment, She had a repeat Echo 06/2021 which revealed LVEF 60-65% with mild LVH. She continued to have mild AS and AR. Today, she has been feeling fine overall. She is a little concerned after a recent home health nursing visit with [employer]. Her blood pressure was found to be too low using a wrist cuff, and she was also told she had PAD. At home her blood pressure has never been higher than 132 systolic. On average it is usually 128-130. Her breathing is somewhat heavier when she goes downstairs or walks up a hill. She will need to stop and rest if  going up a very steep hill. She would not describe this as being short of breath, just needing to rest a moment. Lately she has not been exercising enough. When she goes to the gym she works out for an hour, including an exercise bike and some weight training. She states her diet is "not good". She tends to cycle between losing a few lbs and then eating too much and regaining that weight. She denies any palpitations, chest pain, or peripheral edema. No lightheadedness, headaches, syncope, orthopnea, or PND.   Past Medical History:  Diagnosis Date   Abnormal finding on screening procedure 07/07/2021   Acid reflux    Arthritis    Diverticulosis    Essential hypertension 10/17/2015   GERD (gastroesophageal reflux disease)    H/O hiatal hernia    Heart murmur    History of blood transfusion    07/2012 - several blood transfusions per patient    History of primary hypertension    Hypertension    Hyponatremia    h/o hyponatremia on diuretics   Mild aortic stenosis 03/29/2017   Mean gradient 14 mmHg on echo 02/2017.   Panic attack    Pure hypercholesterolemia 07/07/2021    Past Surgical History:  Procedure Laterality Date   BUNIONECTOMY     CATARACT EXTRACTION Right 2018   colon surgery for devierticulosis   2013   [address] [place], [place]   TOTAL HIP ARTHROPLASTY Right 09/30/2013  Procedure: RIGHT TOTAL HIP ARTHROPLASTY ANTERIOR APPROACH;  Surgeon: Mauri Pole, MD;  Location: [facility];  Service: Orthopedics;  Laterality: Right;   TOTAL HIP ARTHROPLASTY Left 05/11/2014   Procedure: LEFT TOTAL HIP ARTHROPLASTY ANTERIOR APPROACH;  Surgeon: Mauri Pole, MD;  Location: [facility];  Service: Orthopedics;  Laterality: Left;   TUBAL LIGATION      Current Outpatient Medications  Medication Sig Dispense Refill   alendronate (FOSAMAX) 70 MG tablet 1 tablet 30 minutes before the first food, beverage or medicine of the day with plain water     amLODipine (NORVASC) 10 MG tablet TAKE 1 TABLET BY MOUTH EVERY  DAY 90 tablet 3   b complex vitamins capsule Take 1 capsule by mouth daily.     BIOTIN 5000 PO Take 1 capsule by mouth every morning.      buPROPion (WELLBUTRIN XL) 300 MG 24 hr tablet 1 tablet in the morning     Calcium Carbonate-Vitamin D 600-200 MG-UNIT TABS Take 1 tablet 2 (two) times daily by mouth.      carvedilol (COREG) 25 MG tablet TAKE 1 TABLET BY MOUTH TWICE A DAY 180 tablet 3   Coenzyme Q10 300 MG CAPS Take by mouth daily.     cycloSPORINE (RESTASIS) 0.05 % ophthalmic emulsion 1 drop in both eyes     fluticasone (FLONASE) 50 MCG/ACT nasal spray Place 2 sprays as needed into both nostrils for allergies.   5   lisinopril (PRINIVIL,ZESTRIL) 20 MG tablet Take 20 mg by mouth 2 (two) times daily.     Magnesium 250 MG TABS Take 1 tablet by mouth every other day.     Multiple Vitamin (MULTIVITAMIN WITH MINERALS) TABS tablet Take 1 tablet by mouth every morning.      Omega 3 1200 MG CAPS Take 2 capsules by mouth every morning.      ranitidine (ZANTAC) 150 MG tablet Take 150 mg by mouth as needed for heartburn.     TURMERIC PO Take 1 capsule by mouth daily. PATIENT NOT SURE OF DOSAGE     vitamin B-12 (CYANOCOBALAMIN) 500 MCG tablet Take 500 mcg by mouth every morning.      VITAMIN E PO Take 1 capsule by mouth daily. PATIENT NOT SURE OF DOSAGE     No current facility-administered medications for this visit.    Allergies:   Patient has no known allergies.   Social History:  The patient  reports that she has never smoked. She has never used smokeless tobacco. She reports that she does not drink alcohol and does not use drugs.   Family History:  The patient's family history includes Colon cancer in her mother; Mitral valve prolapse in her father, paternal aunt, and sister.    ROS:   Please see the history of present illness. All other systems are reviewed and negative.    PHYSICAL EXAM: VS:  BP 136/76 (BP Location: Right Arm, Patient Position: Sitting, Cuff Size: Normal)    Pulse 62     Ht 5' (1.524 m)    Wt 149 lb (67.6 kg)    SpO2 97%    BMI 29.10 kg/m  , BMI Body mass index is 29.1 kg/m. GENERAL:  Well appearing HEENT: Pupils equal round and reactive, fundi not visualized, oral mucosa unremarkable NECK:  No jugular venous distention, waveform within normal limits, carotid upstroke brisk and symmetric, no bruits LUNGS:  Clear to auscultation bilaterally HEART:  RRR.  PMI not displaced or sustained,S1 and S2 within normal  limits, no S3, no S4, no clicks, no rubs, II/VI early peaking systolic murmur at LUSB. ABD:  Flat, positive bowel sounds normal in frequency in pitch, no bruits, no rebound, no guarding, no midline pulsatile mass, no hepatomegaly, no splenomegaly EXT:  2 plus pulses throughout, no edema, no cyanosis no clubbing SKIN:  No rashes no nodules NEURO:  Cranial nerves II through XII grossly intact, motor grossly intact throughout PSYCH:  Cognitively intact, oriented to person place and time   EKG:  07/07/2021: EKG was not ordered. 01/06/2021: Sinus bradycardia. Rate 54 bpm. 05/11/2020: Sinus bradycardia.  Rate 55 bpm.  Cannot rule out prior septal infarct. 04/25/19: Sinus rhythm.  Rate 64 bpm. 11/15/17: Sinus bradycardia.  Rate 54 bpm.   02/19/17: sinus rhythm. Rate 66 bpm. Occasional PVCs.  Echo 06/30/2021:  1. Left ventricular ejection fraction, by estimation, is 60 to 65%. The  left ventricle has normal function. The left ventricle has no regional  wall motion abnormalities. There is mild left ventricular hypertrophy of  the basal-septal segment. Left  ventricular diastolic parameters were normal. The average left ventricular  global longitudinal strain is -21.2 %. The global longitudinal strain is  normal.   2. Right ventricular systolic function is normal. The right ventricular  size is normal. There is normal pulmonary artery systolic pressure. The  estimated right ventricular systolic pressure is [id number] mmHg.   3. The mitral valve is degenerative.  Trivial mitral valve regurgitation.  No evidence of mitral stenosis. Moderate mitral annular calcification.   4. The aortic valve is calcified. Aortic valve regurgitation is mild.  Mild aortic valve stenosis. Aortic regurgitation PHT measures 453 msec.  Aortic valve area, by VTI measures 1.38 cm. Aortic valve mean gradient  measures 17.0 mmHg. Aortic valve Vmax  measures 2.78 m/s.   5. The inferior vena cava is normal in size with greater than 50%  respiratory variability, suggesting right atrial pressure of 3 mmHg.   6. Compared to study 04/28/2020, essentially unchanged.   Echo 04/28/2020:  1. Left ventricular ejection fraction, by estimation, is 60 to 65%. The  left ventricle has normal function. The left ventricle has no regional  wall motion abnormalities. There is mild asymmetric left ventricular  hypertrophy of the basal-septal segment.  Left ventricular diastolic parameters were normal.   2. Right ventricular systolic function is normal. The right ventricular  size is normal. There is normal pulmonary artery systolic pressure. The  estimated right ventricular systolic pressure is [id number] mmHg.   3. The mitral valve is grossly normal. Mild mitral valve regurgitation.  No evidence of mitral stenosis. Moderate mitral annular calcification.   4. Tricuspid valve regurgitation is mild to moderate.   5. The aortic valve is abnormal. There is moderate calcification of the  aortic valve. Aortic valve regurgitation is mild. Mild aortic valve  stenosis. Aortic valve mean gradient measures 15.5 mmHg.   6. The inferior vena cava is normal in size with greater than 50%  respiratory variability, suggesting right atrial pressure of 3 mmHg.   Echo 03/11/18: Study Conclusions   - Left ventricle: The cavity size was normal. Systolic function was   normal. The estimated ejection fraction was in the range of 60%   to 65%. Wall motion was normal; there were no regional wall   motion abnormalities.  Features are consistent with a pseudonormal   left ventricular filling pattern, with concomitant abnormal   relaxation and increased filling pressure (grade 2 diastolic   dysfunction). Doppler parameters are consistent  with   indeterminate ventricular filling pressure. - Aortic valve: Valve mobility was restricted. There was mild   stenosis. There was mild regurgitation. Peak velocity (S): 232   cm/s. Mean gradient (S): 11 mm Hg. Peak gradient (S): 22 mm Hg.   Regurgitation pressure half-time: 545 ms. - Mitral valve: Transvalvular velocity was within the normal range.   There was no evidence for stenosis. There was mild regurgitation. - Left atrium: The atrium was mildly dilated. - Right ventricle: The cavity size was normal. Wall thickness was   normal. Systolic function was normal. - Atrial septum: No defect or patent foramen ovale was identified. - Tricuspid valve: There was mild regurgitation. - Pulmonary arteries: PA peak pressure: 35 mm Hg (S). - Global longitudinal strain -21.9% (normal).  Recent Labs: No results found for requested labs within last 8760 hours.    12/26/16: Total cholesterol 163, triglycerides 96, HDL 65, LDL 79 Sodium 135, potassium 4.5, BUN 13, creatinine 0.7. AST 17, ALT 15  Lipid Panel No results found for: CHOL, TRIG, HDL, CHOLHDL, VLDL, LDLCALC, LDLDIRECT  Wt Readings from Last 3 Encounters:  07/07/21 149 lb (67.6 kg)  01/06/21 153 lb 3.2 oz (69.5 kg)  05/11/20 152 lb 9.6 oz (69.2 kg)     ASSESSMENT AND PLAN:  Essential hypertension BP has been well-controlled at home.  She just joined a gym and is getting more exercise.  Continue amlodipine, carvedilol, lisinopril.  She will start back working on her diet as well.  Mild aortic stenosis Mean gradient unchanged on recent echo.  She is euvolemic and doing well.  Pure hypercholesterolemia Her lipids have been borderline for years and she was previously taken off of his statin, though she  tolerated it well.  She is interested in getting a coronary calcium score to better understand her risk.  We will order this and then reassess whether or not she needs more aggressive cholesterol management.  Abnormal finding on screening procedure She had home PAD screening that was reportedly abnormal.  She has good dorsalis pedal pulses.  TP is a little more faint.  We will get ABIs and arterial Dopplers.   Current medicines are reviewed at length with the patient today.  The patient does not have concerns regarding medicines.  The following changes have been made: None  Labs/ tests ordered today include:   Orders Placed This Encounter  Procedures   CT CARDIAC SCORING (SELF PAY ONLY)   VAS [place] ABI WITH/WO TBI   VAS [place] LOWER EXTREMITY ARTERIAL DUPLEX   Disposition:    FU with Rose Burgess C. Oval Linsey, MD, [facility] in 1 year.  I,Mathew Stumpf,acting as a Education administrator for Skeet Latch, MD.,have documented all relevant documentation on the behalf of Skeet Latch, MD,as directed by  Skeet Latch, MD while in the presence of Skeet Latch, MD.  I, Rose [place] Oval Linsey, MD have reviewed all documentation for this visit.  The documentation of the exam, diagnosis, procedures, and orders on 07/07/2021 are all accurate and complete.  Signed, Rose Lund C. Oval Linsey, MD, [facility]  07/07/2021 5:30 PM    [place]

## 2021-07-07 ENCOUNTER — Ambulatory Visit (HOSPITAL_BASED_OUTPATIENT_CLINIC_OR_DEPARTMENT_OTHER): Payer: Medicare PPO | Admitting: Cardiovascular Disease

## 2021-07-07 ENCOUNTER — Other Ambulatory Visit: Payer: Self-pay

## 2021-07-07 ENCOUNTER — Encounter (HOSPITAL_BASED_OUTPATIENT_CLINIC_OR_DEPARTMENT_OTHER): Payer: Self-pay | Admitting: Cardiovascular Disease

## 2021-07-07 DIAGNOSIS — E7800 Pure hypercholesterolemia, unspecified: Secondary | ICD-10-CM

## 2021-07-07 DIAGNOSIS — I10 Essential (primary) hypertension: Secondary | ICD-10-CM

## 2021-07-07 DIAGNOSIS — R6889 Other general symptoms and signs: Secondary | ICD-10-CM | POA: Diagnosis not present

## 2021-07-07 DIAGNOSIS — I350 Nonrheumatic aortic (valve) stenosis: Secondary | ICD-10-CM

## 2021-07-07 HISTORY — DX: Other general symptoms and signs: R68.89

## 2021-07-07 HISTORY — DX: Pure hypercholesterolemia, unspecified: E78.00

## 2021-07-07 NOTE — Patient Instructions (Signed)
Medication Instructions:  Your physician recommends that you continue on your current medications as directed. Please refer to the Current Medication list given to you today.   *If you need a refill on your cardiac medications before your next appointment, please call your pharmacy*  Lab Work: NONE    Testing/Procedures: CALCIUM SCORE - THIS WILL COST YOU $99 OUT OF POCKET  Your physician has requested that you have a lower or upper extremity arterial duplex. This test is an ultrasound of the arteries in the legs or arms. It looks at arterial blood flow in the legs and arms. Allow one hour for Lower and Upper Arterial scans. There are no restrictions or special instructions  Your physician has requested that you have an ankle brachial index (ABI). During this test an ultrasound and blood pressure cuff are used to evaluate the arteries that supply the arms and legs with blood. Allow thirty minutes for this exam. There are no restrictions or special instructions.  Follow-Up: At [facility], you and your health needs are our priority.  As part of our continuing mission to provide you with exceptional heart care, we have created designated Provider Care Teams.  These Care Teams include your primary Cardiologist (physician) and Advanced Practice Providers (APPs -  Physician Assistants and Nurse Practitioners) who all work together to provide you with the care you need, when you need it.  We recommend signing up for the patient portal called "MyChart".  Sign up information is provided on this After Visit Summary.  MyChart is used to connect with patients for Virtual Visits (Telemedicine).  Patients are able to view lab/test results, encounter notes, upcoming appointments, etc.  Non-urgent messages can be sent to your provider as well.   To learn more about what you can do with MyChart, go to [email or web address].    Your next appointment:   12 month(s)  The format for your next appointment:    In Person  Provider:   Chilton Si, MD{

## 2021-07-07 NOTE — Assessment & Plan Note (Signed)
BP has been well-controlled at home.  She just joined a gym and is getting more exercise.  Continue amlodipine, carvedilol, lisinopril.  She will start back working on her diet as well.

## 2021-07-07 NOTE — Assessment & Plan Note (Signed)
Mean gradient unchanged on recent echo.  She is euvolemic and doing well.

## 2021-07-07 NOTE — Assessment & Plan Note (Signed)
She had home PAD screening that was reportedly abnormal.  She has good dorsalis pedal pulses.  TP is a little more faint.  We will get ABIs and arterial Dopplers.

## 2021-07-07 NOTE — Assessment & Plan Note (Addendum)
Her lipids have been borderline for years and she was previously taken off of his statin, though she tolerated it well.  She is interested in getting a coronary calcium score to better understand her risk.  We will order this and then reassess whether or not she needs more aggressive cholesterol management.

## 2021-07-11 ENCOUNTER — Ambulatory Visit (HOSPITAL_BASED_OUTPATIENT_CLINIC_OR_DEPARTMENT_OTHER): Payer: Medicare PPO

## 2021-07-25 ENCOUNTER — Ambulatory Visit (HOSPITAL_BASED_OUTPATIENT_CLINIC_OR_DEPARTMENT_OTHER)
Admission: RE | Admit: 2021-07-25 | Discharge: 2021-07-25 | Disposition: A | Discharge status: Home / Self Care | Payer: Medicare PPO | Source: Ambulatory Visit | Attending: Cardiovascular Disease | Admitting: Cardiovascular Disease

## 2021-07-25 ENCOUNTER — Other Ambulatory Visit: Payer: Self-pay

## 2021-07-25 ENCOUNTER — Ambulatory Visit (HOSPITAL_BASED_OUTPATIENT_CLINIC_OR_DEPARTMENT_OTHER): Payer: Medicare PPO

## 2021-07-25 DIAGNOSIS — I10 Essential (primary) hypertension: Secondary | ICD-10-CM | POA: Insufficient documentation

## 2021-07-25 DIAGNOSIS — E7800 Pure hypercholesterolemia, unspecified: Secondary | ICD-10-CM | POA: Diagnosis not present

## 2021-07-25 DIAGNOSIS — R6889 Other general symptoms and signs: Secondary | ICD-10-CM | POA: Diagnosis not present

## 2021-08-02 ENCOUNTER — Telehealth (HOSPITAL_BASED_OUTPATIENT_CLINIC_OR_DEPARTMENT_OTHER): Payer: Self-pay | Admitting: *Deleted

## 2021-08-02 DIAGNOSIS — Z5181 Encounter for therapeutic drug level monitoring: Secondary | ICD-10-CM

## 2021-08-02 DIAGNOSIS — E7800 Pure hypercholesterolemia, unspecified: Secondary | ICD-10-CM

## 2021-08-02 DIAGNOSIS — I10 Essential (primary) hypertension: Secondary | ICD-10-CM

## 2021-08-02 MED ORDER — ROSUVASTATIN CALCIUM 10 MG PO TABS: 10.0000 mg | ORAL_TABLET | Freq: Every day | ORAL | 3 refills | Status: DC

## 2021-08-02 NOTE — Telephone Encounter (Signed)
Advised patient, verbalized understanding  

## 2021-08-02 NOTE — Telephone Encounter (Signed)
-----   Message from Chilton Si, MD sent at 07/29/2021  4:14 PM EST ----- ?Study shows that she does have some calcium in her coronary arteries and in her aorta.  She has a little less than would be expected for age.  However, given that she does have some plaque recommend that we be more aggressive with her cholesterol management.  Recommend starting rosuvastatin 10 mg daily.  Repeat lipids and a CMP in 2 to 3 months. ?

## 2021-08-09 DIAGNOSIS — E785 Hyperlipidemia, unspecified: Secondary | ICD-10-CM | POA: Diagnosis not present

## 2021-08-09 DIAGNOSIS — M8580 Other specified disorders of bone density and structure, unspecified site: Secondary | ICD-10-CM | POA: Diagnosis not present

## 2021-08-09 DIAGNOSIS — I350 Nonrheumatic aortic (valve) stenosis: Secondary | ICD-10-CM | POA: Diagnosis not present

## 2021-08-09 DIAGNOSIS — M9900 Segmental and somatic dysfunction of head region: Secondary | ICD-10-CM | POA: Diagnosis not present

## 2021-08-09 DIAGNOSIS — M9901 Segmental and somatic dysfunction of cervical region: Secondary | ICD-10-CM | POA: Diagnosis not present

## 2021-08-09 DIAGNOSIS — K219 Gastro-esophageal reflux disease without esophagitis: Secondary | ICD-10-CM | POA: Diagnosis not present

## 2021-08-09 DIAGNOSIS — I700 Atherosclerosis of aorta: Secondary | ICD-10-CM | POA: Diagnosis not present

## 2021-08-09 DIAGNOSIS — M542 Cervicalgia: Secondary | ICD-10-CM | POA: Diagnosis not present

## 2021-08-09 DIAGNOSIS — M9908 Segmental and somatic dysfunction of rib cage: Secondary | ICD-10-CM | POA: Diagnosis not present

## 2021-08-09 DIAGNOSIS — F3341 Major depressive disorder, recurrent, in partial remission: Secondary | ICD-10-CM | POA: Diagnosis not present

## 2021-08-09 DIAGNOSIS — M9902 Segmental and somatic dysfunction of thoracic region: Secondary | ICD-10-CM | POA: Diagnosis not present

## 2021-08-09 DIAGNOSIS — M9907 Segmental and somatic dysfunction of upper extremity: Secondary | ICD-10-CM | POA: Diagnosis not present

## 2021-08-09 DIAGNOSIS — I10 Essential (primary) hypertension: Secondary | ICD-10-CM | POA: Diagnosis not present

## 2021-08-09 DIAGNOSIS — F419 Anxiety disorder, unspecified: Secondary | ICD-10-CM | POA: Diagnosis not present

## 2021-08-12 ENCOUNTER — Other Ambulatory Visit: Payer: Self-pay | Admitting: Cardiovascular Disease

## 2021-08-17 DIAGNOSIS — M5030 Other cervical disc degeneration, unspecified cervical region: Secondary | ICD-10-CM | POA: Diagnosis not present

## 2021-08-28 DIAGNOSIS — W19XXXA Unspecified fall, initial encounter: Secondary | ICD-10-CM | POA: Diagnosis not present

## 2021-08-28 DIAGNOSIS — I10 Essential (primary) hypertension: Secondary | ICD-10-CM | POA: Diagnosis not present

## 2021-08-28 DIAGNOSIS — Z043 Encounter for examination and observation following other accident: Secondary | ICD-10-CM | POA: Diagnosis not present

## 2021-08-28 DIAGNOSIS — S0232XA Fracture of orbital floor, left side, initial encounter for closed fracture: Secondary | ICD-10-CM | POA: Diagnosis not present

## 2021-08-28 DIAGNOSIS — R609 Edema, unspecified: Secondary | ICD-10-CM | POA: Diagnosis not present

## 2021-08-28 DIAGNOSIS — E785 Hyperlipidemia, unspecified: Secondary | ICD-10-CM | POA: Diagnosis not present

## 2021-08-28 DIAGNOSIS — S0285XA Fracture of orbit, unspecified, initial encounter for closed fracture: Secondary | ICD-10-CM | POA: Diagnosis not present

## 2021-08-28 DIAGNOSIS — Z743 Need for continuous supervision: Secondary | ICD-10-CM | POA: Diagnosis not present

## 2021-08-28 DIAGNOSIS — M4312 Spondylolisthesis, cervical region: Secondary | ICD-10-CM | POA: Diagnosis not present

## 2021-08-31 DIAGNOSIS — R911 Solitary pulmonary nodule: Secondary | ICD-10-CM | POA: Diagnosis not present

## 2021-08-31 DIAGNOSIS — W108XXA Fall (on) (from) other stairs and steps, initial encounter: Secondary | ICD-10-CM | POA: Diagnosis not present

## 2021-08-31 DIAGNOSIS — S0285XA Fracture of orbit, unspecified, initial encounter for closed fracture: Secondary | ICD-10-CM | POA: Diagnosis not present

## 2021-08-31 DIAGNOSIS — I10 Essential (primary) hypertension: Secondary | ICD-10-CM | POA: Diagnosis not present

## 2021-09-01 DIAGNOSIS — S0232XA Fracture of orbital floor, left side, initial encounter for closed fracture: Secondary | ICD-10-CM | POA: Diagnosis not present

## 2021-09-07 DIAGNOSIS — S0285XD Fracture of orbit, unspecified, subsequent encounter for fracture with routine healing: Secondary | ICD-10-CM | POA: Diagnosis not present

## 2021-09-19 DIAGNOSIS — Z1231 Encounter for screening mammogram for malignant neoplasm of breast: Secondary | ICD-10-CM | POA: Diagnosis not present

## 2021-10-03 DIAGNOSIS — M542 Cervicalgia: Secondary | ICD-10-CM | POA: Diagnosis not present

## 2021-10-10 DIAGNOSIS — M542 Cervicalgia: Secondary | ICD-10-CM | POA: Diagnosis not present

## 2021-10-12 DIAGNOSIS — M542 Cervicalgia: Secondary | ICD-10-CM | POA: Diagnosis not present

## 2021-10-13 DIAGNOSIS — H04123 Dry eye syndrome of bilateral lacrimal glands: Secondary | ICD-10-CM | POA: Diagnosis not present

## 2021-10-13 DIAGNOSIS — S0232XA Fracture of orbital floor, left side, initial encounter for closed fracture: Secondary | ICD-10-CM | POA: Diagnosis not present

## 2021-10-18 DIAGNOSIS — M542 Cervicalgia: Secondary | ICD-10-CM | POA: Diagnosis not present

## 2021-10-20 DIAGNOSIS — Z01419 Encounter for gynecological examination (general) (routine) without abnormal findings: Secondary | ICD-10-CM | POA: Diagnosis not present

## 2021-10-20 DIAGNOSIS — M542 Cervicalgia: Secondary | ICD-10-CM | POA: Diagnosis not present

## 2021-10-20 DIAGNOSIS — Z6829 Body mass index (BMI) 29.0-29.9, adult: Secondary | ICD-10-CM | POA: Diagnosis not present

## 2021-11-05 LAB — COMPREHENSIVE METABOLIC PANEL
ALT: 20 IU/L (ref 0–32)
AST: 21 IU/L (ref 0–40)
Albumin/Globulin Ratio: 2.3 — ABNORMAL HIGH (ref 1.2–2.2)
Albumin: 4.2 g/dL (ref 3.7–4.7)
Alkaline Phosphatase: 58 IU/L (ref 44–121)
BUN/Creatinine Ratio: 17 (ref 12–28)
BUN: 16 mg/dL (ref 8–27)
Bilirubin Total: 0.6 mg/dL (ref 0.0–1.2)
CO2: 22 mmol/L (ref 20–29)
Calcium: 9.2 mg/dL (ref 8.7–10.3)
Chloride: 103 mmol/L (ref 96–106)
Creatinine, Ser: 0.96 mg/dL (ref 0.57–1.00)
Globulin, Total: 1.8 g/dL (ref 1.5–4.5)
Glucose: 92 mg/dL (ref 70–99)
Potassium: 4.9 mmol/L (ref 3.5–5.2)
Sodium: 143 mmol/L (ref 134–144)
Total Protein: 6 g/dL (ref 6.0–8.5)
eGFR: 61 mL/min/{1.73_m2} (ref >59)

## 2021-11-05 LAB — LIPID PANEL
Chol/HDL Ratio: 1.8 ratio (ref 0.0–4.4)
Cholesterol, Total: 125 mg/dL (ref 100–199)
HDL: 68 mg/dL (ref >39)
LDL Chol Calc (NIH): 43 mg/dL (ref 0–99)
Triglycerides: 69 mg/dL (ref 0–149)
VLDL Cholesterol Cal: 14 mg/dL (ref 5–40)

## 2022-02-28 DIAGNOSIS — L821 Other seborrheic keratosis: Secondary | ICD-10-CM | POA: Diagnosis not present

## 2022-02-28 DIAGNOSIS — D225 Melanocytic nevi of trunk: Secondary | ICD-10-CM | POA: Diagnosis not present

## 2022-02-28 DIAGNOSIS — D2272 Melanocytic nevi of left lower limb, including hip: Secondary | ICD-10-CM | POA: Diagnosis not present

## 2022-02-28 DIAGNOSIS — L578 Other skin changes due to chronic exposure to nonionizing radiation: Secondary | ICD-10-CM | POA: Diagnosis not present

## 2022-02-28 DIAGNOSIS — D2271 Melanocytic nevi of right lower limb, including hip: Secondary | ICD-10-CM | POA: Diagnosis not present

## 2022-02-28 DIAGNOSIS — L814 Other melanin hyperpigmentation: Secondary | ICD-10-CM | POA: Diagnosis not present

## 2022-04-10 DIAGNOSIS — Z6830 Body mass index (BMI) 30.0-30.9, adult: Secondary | ICD-10-CM | POA: Diagnosis not present

## 2022-04-10 DIAGNOSIS — M47812 Spondylosis without myelopathy or radiculopathy, cervical region: Secondary | ICD-10-CM | POA: Diagnosis not present

## 2022-04-18 DIAGNOSIS — F419 Anxiety disorder, unspecified: Secondary | ICD-10-CM | POA: Diagnosis not present

## 2022-04-18 DIAGNOSIS — E785 Hyperlipidemia, unspecified: Secondary | ICD-10-CM | POA: Diagnosis not present

## 2022-04-18 DIAGNOSIS — I10 Essential (primary) hypertension: Secondary | ICD-10-CM | POA: Diagnosis not present

## 2022-04-18 DIAGNOSIS — M810 Age-related osteoporosis without current pathological fracture: Secondary | ICD-10-CM | POA: Diagnosis not present

## 2022-04-25 DIAGNOSIS — I10 Essential (primary) hypertension: Secondary | ICD-10-CM | POA: Diagnosis not present

## 2022-04-25 DIAGNOSIS — M9907 Segmental and somatic dysfunction of upper extremity: Secondary | ICD-10-CM | POA: Diagnosis not present

## 2022-04-25 DIAGNOSIS — M9901 Segmental and somatic dysfunction of cervical region: Secondary | ICD-10-CM | POA: Diagnosis not present

## 2022-04-25 DIAGNOSIS — M9900 Segmental and somatic dysfunction of head region: Secondary | ICD-10-CM | POA: Diagnosis not present

## 2022-04-25 DIAGNOSIS — M810 Age-related osteoporosis without current pathological fracture: Secondary | ICD-10-CM | POA: Diagnosis not present

## 2022-04-25 DIAGNOSIS — Z0000 Encounter for general adult medical examination without abnormal findings: Secondary | ICD-10-CM | POA: Diagnosis not present

## 2022-04-25 DIAGNOSIS — F419 Anxiety disorder, unspecified: Secondary | ICD-10-CM | POA: Diagnosis not present

## 2022-04-25 DIAGNOSIS — Z1331 Encounter for screening for depression: Secondary | ICD-10-CM | POA: Diagnosis not present

## 2022-04-25 DIAGNOSIS — M9902 Segmental and somatic dysfunction of thoracic region: Secondary | ICD-10-CM | POA: Diagnosis not present

## 2022-04-25 DIAGNOSIS — E785 Hyperlipidemia, unspecified: Secondary | ICD-10-CM | POA: Diagnosis not present

## 2022-04-25 DIAGNOSIS — I700 Atherosclerosis of aorta: Secondary | ICD-10-CM | POA: Diagnosis not present

## 2022-04-25 DIAGNOSIS — M9908 Segmental and somatic dysfunction of rib cage: Secondary | ICD-10-CM | POA: Diagnosis not present

## 2022-04-25 DIAGNOSIS — M8580 Other specified disorders of bone density and structure, unspecified site: Secondary | ICD-10-CM | POA: Diagnosis not present

## 2022-04-25 DIAGNOSIS — M542 Cervicalgia: Secondary | ICD-10-CM | POA: Diagnosis not present

## 2022-04-25 DIAGNOSIS — I350 Nonrheumatic aortic (valve) stenosis: Secondary | ICD-10-CM | POA: Diagnosis not present

## 2022-06-14 DIAGNOSIS — J069 Acute upper respiratory infection, unspecified: Secondary | ICD-10-CM | POA: Diagnosis not present

## 2022-06-14 DIAGNOSIS — R051 Acute cough: Secondary | ICD-10-CM | POA: Diagnosis not present

## 2022-06-14 DIAGNOSIS — J40 Bronchitis, not specified as acute or chronic: Secondary | ICD-10-CM | POA: Diagnosis not present

## 2022-06-14 DIAGNOSIS — I10 Essential (primary) hypertension: Secondary | ICD-10-CM | POA: Diagnosis not present

## 2022-08-18 ENCOUNTER — Other Ambulatory Visit (HOSPITAL_BASED_OUTPATIENT_CLINIC_OR_DEPARTMENT_OTHER): Payer: Self-pay | Admitting: Cardiovascular Disease

## 2022-09-25 DIAGNOSIS — Z1231 Encounter for screening mammogram for malignant neoplasm of breast: Secondary | ICD-10-CM | POA: Diagnosis not present

## 2022-10-12 ENCOUNTER — Telehealth (HOSPITAL_COMMUNITY): Payer: Self-pay | Admitting: Vascular Surgery

## 2022-10-12 NOTE — Telephone Encounter (Signed)
Lvm to make new pt appt w/ [facility] next ava is fine

## 2022-10-24 DIAGNOSIS — K219 Gastro-esophageal reflux disease without esophagitis: Secondary | ICD-10-CM | POA: Diagnosis not present

## 2022-10-24 DIAGNOSIS — I10 Essential (primary) hypertension: Secondary | ICD-10-CM | POA: Diagnosis not present

## 2022-10-24 DIAGNOSIS — I350 Nonrheumatic aortic (valve) stenosis: Secondary | ICD-10-CM | POA: Diagnosis not present

## 2022-10-24 DIAGNOSIS — F3341 Major depressive disorder, recurrent, in partial remission: Secondary | ICD-10-CM | POA: Diagnosis not present

## 2022-10-24 DIAGNOSIS — I700 Atherosclerosis of aorta: Secondary | ICD-10-CM | POA: Diagnosis not present

## 2022-10-24 DIAGNOSIS — F419 Anxiety disorder, unspecified: Secondary | ICD-10-CM | POA: Diagnosis not present

## 2022-10-24 DIAGNOSIS — M810 Age-related osteoporosis without current pathological fracture: Secondary | ICD-10-CM | POA: Diagnosis not present

## 2022-10-24 DIAGNOSIS — E785 Hyperlipidemia, unspecified: Secondary | ICD-10-CM | POA: Diagnosis not present

## 2022-11-06 DIAGNOSIS — H26493 Other secondary cataract, bilateral: Secondary | ICD-10-CM | POA: Diagnosis not present

## 2022-11-06 DIAGNOSIS — H524 Presbyopia: Secondary | ICD-10-CM | POA: Diagnosis not present

## 2022-11-16 ENCOUNTER — Encounter (HOSPITAL_COMMUNITY): Payer: Self-pay | Admitting: Cardiology

## 2022-11-16 ENCOUNTER — Ambulatory Visit (HOSPITAL_COMMUNITY)
Admission: RE | Admit: 2022-11-16 | Discharge: 2022-11-16 | Disposition: A | Discharge status: Home / Self Care | Payer: Medicare PPO | Source: Ambulatory Visit | Attending: Cardiology | Admitting: Cardiology

## 2022-11-16 VITALS — BP 134/60 | HR 55 | Wt 154.4 lb

## 2022-11-16 DIAGNOSIS — Z8249 Family history of ischemic heart disease and other diseases of the circulatory system: Secondary | ICD-10-CM | POA: Insufficient documentation

## 2022-11-16 DIAGNOSIS — Z79899 Other long term (current) drug therapy: Secondary | ICD-10-CM | POA: Insufficient documentation

## 2022-11-16 DIAGNOSIS — I2510 Atherosclerotic heart disease of native coronary artery without angina pectoris: Secondary | ICD-10-CM | POA: Diagnosis not present

## 2022-11-16 DIAGNOSIS — R600 Localized edema: Secondary | ICD-10-CM | POA: Insufficient documentation

## 2022-11-16 DIAGNOSIS — R001 Bradycardia, unspecified: Secondary | ICD-10-CM | POA: Diagnosis not present

## 2022-11-16 DIAGNOSIS — I350 Nonrheumatic aortic (valve) stenosis: Secondary | ICD-10-CM | POA: Insufficient documentation

## 2022-11-16 DIAGNOSIS — E7800 Pure hypercholesterolemia, unspecified: Secondary | ICD-10-CM | POA: Diagnosis not present

## 2022-11-16 DIAGNOSIS — I10 Essential (primary) hypertension: Secondary | ICD-10-CM | POA: Diagnosis not present

## 2022-11-16 LAB — BASIC METABOLIC PANEL
Anion gap: 7 (ref 5–15)
BUN: 22 mg/dL (ref 8–23)
CO2: 26 mmol/L (ref 22–32)
Calcium: 9.2 mg/dL (ref 8.9–10.3)
Chloride: 101 mmol/L (ref 98–111)
Creatinine, Ser: 1.12 mg/dL — ABNORMAL HIGH (ref 0.44–1.00)
GFR, Estimated: 50 mL/min — ABNORMAL LOW (ref >60)
Glucose, Bld: 105 mg/dL — ABNORMAL HIGH (ref 70–99)
Potassium: 5 mmol/L (ref 3.5–5.1)
Sodium: 134 mmol/L — ABNORMAL LOW (ref 135–145)

## 2022-11-16 LAB — LIPID PANEL
Cholesterol: 104 mg/dL (ref 0–200)
HDL: 55 mg/dL (ref >40)
LDL Cholesterol: 39 mg/dL (ref 0–99)
Total CHOL/HDL Ratio: 1.9 RATIO
Triglycerides: 51 mg/dL (ref <150)
VLDL: 10 mg/dL (ref 0–40)

## 2022-11-16 NOTE — Patient Instructions (Signed)
There has been no changes to your medications.  Labs done today, your results will be available in MyChart, we will contact you for abnormal readings.  Your physician has requested that you have an echocardiogram. Echocardiography is a painless test that uses sound waves to create images of your heart. It provides your doctor with information about the size and shape of your heart and how well your heart's chambers and valves are working. This procedure takes approximately one hour. There are no restrictions for this procedure. Please do NOT wear cologne, perfume, aftershave, or lotions (deodorant is allowed). Please arrive 15 minutes prior to your appointment time.  Your physician recommends that you schedule a follow-up appointment in: 1 year ( June 2025) ** please call the office in April 2025 to arrange your follow up appointment. **  If you have any questions or concerns before your next appointment please send [place] a message through [place] or call our office at [phone].    TO LEAVE A MESSAGE FOR THE NURSE SELECT OPTION 2, PLEASE LEAVE A MESSAGE INCLUDING: YOUR NAME DATE OF BIRTH CALL BACK NUMBER REASON FOR CALL**this is important as we prioritize the call backs  YOU WILL RECEIVE A CALL BACK THE SAME DAY AS LONG AS YOU CALL BEFORE 4:00 PM  At the [facility], you and your health needs are our priority. As part of our continuing mission to provide you with exceptional heart care, we have created designated Provider Care Teams. These Care Teams include your primary Cardiologist (physician) and Advanced Practice Providers (APPs- Physician Assistants and Nurse Practitioners) who all work together to provide you with the care you need, when you need it.   You may see any of the following providers on your designated Care Team at your next follow up: Dr Arvilla Meres Dr Marca Ancona Dr. Marcos Eke, NP Robbie Lis, [place] [facility] [place], [place] Brynda Peon, NP Karle Plumber, PharmD   Please be sure to bring in all your medications bottles to every appointment.    Thank you for choosing [facility]

## 2022-11-17 NOTE — Progress Notes (Signed)
PCP: Charlane Ferretti, DO Cardiology: Dr. Shirlee Latch  77 y.o. with history of HTN and aortic stenosis was self-referred for cardiology evaluation.  She has had long-standing HTN and is on 3 anti-hypertensive agents with good BP control currently.  She has been found to have mild aortic stenosis by serial echoes that has remained relatively stable over several years.  Calcium score in 3/23 showed 43.6 Agatston units (42nd percentile).   She has been doing well recently.  No significant exertional dyspnea or chest pain.  No problem with stairs.  Not getting much formal exercise.  SBP generally in 120s at home when she checks.   ECG (personally reviewed): NSR, normal  Labs (6/23): LDL 43, K 4.9, creatinine 1.61  PMH: 1. HTN 2. Aortic stenosis: Echo (2/23) with EF 60-65%, mild LVH, normal RV, mild AS with mean gradient 17 mmHg.  3. GERD 4. H/o GI bleeding 5. Anxiety 6. Hyperlipidemia 7. Bilateral THR 8. CAD: Calcium score scan 3/23 with 43.6 AU (42nd percentile).  9. ABIs (12/19): No evidence for PAD.   SH: No smoking or ETOH, married, lives in [place], 2 children.   FH: Father with "valve replacement," aunt with "valve replacement," mother with HTN.   ROS: All systems reviewed and negative except as per HPI.   Current Outpatient Medications  Medication Sig Dispense Refill   alendronate (FOSAMAX) 70 MG tablet 1 tablet 30 minutes before the first food, beverage or medicine of the day with plain water     amLODipine (NORVASC) 10 MG tablet TAKE 1 TABLET BY MOUTH EVERY DAY 90 tablet 3   b complex vitamins capsule Take 1 capsule by mouth daily.     BIOTIN 5000 PO Take 1 capsule by mouth every morning.      buPROPion (WELLBUTRIN XL) 300 MG 24 hr tablet 1 tablet in the morning     Calcium Carbonate-Vitamin D 600-200 MG-UNIT TABS Take 1 tablet 2 (two) times daily by mouth.      carvedilol (COREG) 25 MG tablet TAKE 1 TABLET BY MOUTH TWICE A DAY 180 tablet 3   Coenzyme Q10 300 MG CAPS Take by  mouth daily.     cycloSPORINE (RESTASIS) 0.05 % ophthalmic emulsion 1 drop in both eyes     fluticasone (FLONASE) 50 MCG/ACT nasal spray Place 2 sprays as needed into both nostrils for allergies.   5   lisinopril (PRINIVIL,ZESTRIL) 20 MG tablet Take 20 mg by mouth 2 (two) times daily.     Magnesium 250 MG TABS Take 1 tablet by mouth every other day.     Multiple Vitamin (MULTIVITAMIN WITH MINERALS) TABS tablet Take 1 tablet by mouth every morning.      Omega 3 1200 MG CAPS Take 2 capsules by mouth every morning.      ranitidine (ZANTAC) 150 MG tablet Take 150 mg by mouth as needed for heartburn.     rosuvastatin (CRESTOR) 10 MG tablet TAKE 1 TABLET BY MOUTH EVERY DAY 90 tablet 3   TURMERIC PO Take 1 capsule by mouth daily. PATIENT NOT SURE OF DOSAGE     vitamin B-12 (CYANOCOBALAMIN) 500 MCG tablet Take 500 mcg by mouth every morning.      VITAMIN E PO Take 1 capsule by mouth daily. PATIENT NOT SURE OF DOSAGE     No current facility-administered medications for this encounter.   BP 134/60   Pulse (!) 55   Wt 70 kg (154 lb 6.4 oz)   SpO2 98%   BMI 30.15 kg/m  General: NAD Neck: No JVD, no thyromegaly or thyroid nodule.  Lungs: Clear to auscultation bilaterally with normal respiratory effort. CV: Nondisplaced PMI.  Heart regular S1/S2, no S3/S4, 2/6 SEM RUSB with clear S2.  1+ ankle edema.  No carotid bruit.  Normal pedal pulses.  Abdomen: Soft, nontender, no hepatosplenomegaly, no distention.  Skin: Intact without lesions or rashes.  Neurologic: Alert and oriented x 3.  Psych: Normal affect. Extremities: No clubbing or cyanosis.  HEENT: Normal.   Assessment/Plan: 1. Aortic stenosis: Mild with last echo in 2/23 showing mean gradient 17 mmHg.  - Would arrange for echo later this year.  If no progression, can wait a couple of years before repeat echo.  2. HTN: SBP runs in 120s at home.  She seems to be reasonably controlled on 3 drug regimen.  - Continue amlodipine, mild ankle edema  may be from this.  - Continue lisinopril, check BMET - Continue Coreg.  - Increase exercise and work on some weight loss.  3. CAD: Calcium score 43.6 Agatston units in 3/23, 42nd percentile.  - She is on Crestor, will check lipids today.   Followup in 1 year.   Marca Ancona 11/17/2022

## 2022-11-30 ENCOUNTER — Ambulatory Visit (HOSPITAL_COMMUNITY)
Admission: RE | Admit: 2022-11-30 | Discharge: 2022-11-30 | Disposition: A | Discharge status: Home / Self Care | Payer: Medicare PPO | Source: Ambulatory Visit | Attending: Cardiology | Admitting: Cardiology

## 2022-11-30 DIAGNOSIS — I10 Essential (primary) hypertension: Secondary | ICD-10-CM | POA: Diagnosis not present

## 2022-11-30 DIAGNOSIS — I350 Nonrheumatic aortic (valve) stenosis: Secondary | ICD-10-CM

## 2022-11-30 DIAGNOSIS — I083 Combined rheumatic disorders of mitral, aortic and tricuspid valves: Secondary | ICD-10-CM | POA: Insufficient documentation

## 2022-11-30 LAB — ECHOCARDIOGRAM COMPLETE
AR max vel: 1.37 cm2
AV Area VTI: 1.49 cm2
AV Area mean vel: 1.38 cm2
AV Mean grad: 23 mmHg
AV Peak grad: 38.2 mmHg
Ao pk vel: 3.09 m/s
Area-P 1/2: 2.39 cm2
Calc EF: 71.4 %
MV M vel: 4.82 m/s
MV Peak grad: 92.9 mmHg
MV VTI: 3.36 cm2
P 1/2 time: 483 msec
S' Lateral: 2.6 cm
Single Plane A2C EF: 70.3 %
Single Plane A4C EF: 72.1 %

## 2022-11-30 NOTE — Progress Notes (Signed)
  Echocardiogram 2D Echocardiogram has been performed.  Rose Burgess 11/30/2022, 4:16 PM

## 2022-12-01 ENCOUNTER — Telehealth (HOSPITAL_COMMUNITY): Payer: Self-pay

## 2022-12-01 ENCOUNTER — Ambulatory Visit (HOSPITAL_BASED_OUTPATIENT_CLINIC_OR_DEPARTMENT_OTHER): Payer: Medicare PPO | Admitting: Cardiovascular Disease

## 2022-12-01 NOTE — Telephone Encounter (Signed)
Pt aware, agreeable, and verbalized understanding 

## 2022-12-01 NOTE — Telephone Encounter (Signed)
-----   Message from Marca Ancona sent at 12/01/2022  8:51 AM EDT ----- Moderate aortic stenosis.  Will need to follow closely, no indication for intervention yet.

## 2023-01-30 DIAGNOSIS — M810 Age-related osteoporosis without current pathological fracture: Secondary | ICD-10-CM | POA: Diagnosis not present

## 2023-01-30 DIAGNOSIS — Z01419 Encounter for gynecological examination (general) (routine) without abnormal findings: Secondary | ICD-10-CM | POA: Diagnosis not present

## 2023-01-30 DIAGNOSIS — Z6832 Body mass index (BMI) 32.0-32.9, adult: Secondary | ICD-10-CM | POA: Diagnosis not present

## 2023-02-21 DIAGNOSIS — M170 Bilateral primary osteoarthritis of knee: Secondary | ICD-10-CM | POA: Diagnosis not present

## 2023-04-24 DIAGNOSIS — I10 Essential (primary) hypertension: Secondary | ICD-10-CM | POA: Diagnosis not present

## 2023-04-24 DIAGNOSIS — M810 Age-related osteoporosis without current pathological fracture: Secondary | ICD-10-CM | POA: Diagnosis not present

## 2023-04-24 DIAGNOSIS — E785 Hyperlipidemia, unspecified: Secondary | ICD-10-CM | POA: Diagnosis not present

## 2023-04-24 DIAGNOSIS — F419 Anxiety disorder, unspecified: Secondary | ICD-10-CM | POA: Diagnosis not present

## 2023-04-24 DIAGNOSIS — K219 Gastro-esophageal reflux disease without esophagitis: Secondary | ICD-10-CM | POA: Diagnosis not present

## 2023-05-01 DIAGNOSIS — Z23 Encounter for immunization: Secondary | ICD-10-CM | POA: Diagnosis not present

## 2023-05-01 DIAGNOSIS — Z0000 Encounter for general adult medical examination without abnormal findings: Secondary | ICD-10-CM | POA: Diagnosis not present

## 2023-05-01 DIAGNOSIS — I10 Essential (primary) hypertension: Secondary | ICD-10-CM | POA: Diagnosis not present

## 2023-05-01 DIAGNOSIS — M542 Cervicalgia: Secondary | ICD-10-CM | POA: Diagnosis not present

## 2023-05-01 DIAGNOSIS — E785 Hyperlipidemia, unspecified: Secondary | ICD-10-CM | POA: Diagnosis not present

## 2023-05-01 DIAGNOSIS — I700 Atherosclerosis of aorta: Secondary | ICD-10-CM | POA: Diagnosis not present

## 2023-05-01 DIAGNOSIS — I350 Nonrheumatic aortic (valve) stenosis: Secondary | ICD-10-CM | POA: Diagnosis not present

## 2023-05-01 DIAGNOSIS — R82998 Other abnormal findings in urine: Secondary | ICD-10-CM | POA: Diagnosis not present

## 2023-05-01 DIAGNOSIS — F419 Anxiety disorder, unspecified: Secondary | ICD-10-CM | POA: Diagnosis not present

## 2023-05-01 DIAGNOSIS — Z1331 Encounter for screening for depression: Secondary | ICD-10-CM | POA: Diagnosis not present

## 2023-05-01 DIAGNOSIS — M810 Age-related osteoporosis without current pathological fracture: Secondary | ICD-10-CM | POA: Diagnosis not present

## 2023-07-03 DIAGNOSIS — H26493 Other secondary cataract, bilateral: Secondary | ICD-10-CM | POA: Diagnosis not present

## 2023-07-03 DIAGNOSIS — H04123 Dry eye syndrome of bilateral lacrimal glands: Secondary | ICD-10-CM | POA: Diagnosis not present

## 2023-08-09 DIAGNOSIS — M1711 Unilateral primary osteoarthritis, right knee: Secondary | ICD-10-CM | POA: Diagnosis not present

## 2023-08-09 DIAGNOSIS — M25562 Pain in left knee: Secondary | ICD-10-CM | POA: Diagnosis not present

## 2023-08-15 ENCOUNTER — Other Ambulatory Visit (HOSPITAL_BASED_OUTPATIENT_CLINIC_OR_DEPARTMENT_OTHER): Payer: Self-pay | Admitting: Cardiovascular Disease

## 2023-09-12 ENCOUNTER — Telehealth: Payer: Self-pay | Admitting: *Deleted

## 2023-09-12 NOTE — Telephone Encounter (Signed)
   Pre-operative Risk Assessment    Patient Name: Rose Burgess  DOB: Jul 04, 1945 MRN: [id number]   Date of last office visit: 11/16/22 Date of next office visit: not scheduled   Request for Surgical Clearance    Procedure:   right total knee arthroplasty  Date of Surgery:  Clearance 12/11/23                                Surgeon:  Dr Marna Singleton Group or Practice Name:  [facility] Phone number:  [phone] Fax number:  [phone]   Type of Clearance Requested:   - Medical    Type of Anesthesia:  Spinal   Additional requests/questions:    SignedClive Dancer   09/12/2023, 5:03 PM

## 2023-09-13 ENCOUNTER — Telehealth (HOSPITAL_COMMUNITY): Payer: Self-pay | Admitting: Cardiology

## 2023-09-13 ENCOUNTER — Other Ambulatory Visit (HOSPITAL_COMMUNITY): Payer: Self-pay | Admitting: *Deleted

## 2023-09-13 DIAGNOSIS — I350 Nonrheumatic aortic (valve) stenosis: Secondary | ICD-10-CM

## 2023-09-13 NOTE — Telephone Encounter (Signed)
 Left message to schedule a follow up appointment with the [facility] along with the echo (back to back appt) for surgical clearence.

## 2023-09-13 NOTE — Telephone Encounter (Signed)
 Pt scheduled for an in office appointment, 10/25/23, clearance will be addressed at that time.  Will route to the requesting surgeon's office to make them aware.

## 2023-09-13 NOTE — Telephone Encounter (Signed)
    Primary Cardiologist: Dr. Mitzie Anda  Chart reviewed as part of pre-operative protocol coverage. Because of Rose Burgess past medical history and time since last visit, he/she will require a follow-up visit in order to better assess preoperative cardiovascular risk.  Pre-op covering staff: - Please schedule in office appointment and call patient to inform them. - Please contact requesting surgeon's office via preferred method (i.e, phone, fax) to inform them of need for appointment prior to surgery.  If applicable, this message will also be routed to pharmacy pool and/or primary cardiologist for input on holding anticoagulant/antiplatelet agent as requested below so that this information is available at time of patient's appointment.   Carie Charity, NP  09/13/2023, 7:00 AM

## 2023-09-13 NOTE — Telephone Encounter (Signed)
 Appt scheduled

## 2023-10-01 DIAGNOSIS — Z1231 Encounter for screening mammogram for malignant neoplasm of breast: Secondary | ICD-10-CM | POA: Diagnosis not present

## 2023-10-23 ENCOUNTER — Other Ambulatory Visit (HOSPITAL_COMMUNITY)

## 2023-10-24 ENCOUNTER — Telehealth (HOSPITAL_COMMUNITY): Payer: Self-pay

## 2023-10-24 NOTE — Telephone Encounter (Signed)
 Called and spoke to pt's husband to confirm/remind patient of their appointment at the [facility] on 10/25/23.   Appointment:   [x] Confirmed  [] Left mess   [] No answer/No voice mail  [] VM Full/unable to leave message  [] Phone not in service  Patient reminded to bring all medications and/or complete list.  Confirmed patient has transportation. Gave directions, instructed to utilize valet parking.

## 2023-10-24 NOTE — Progress Notes (Signed)
 [facility]ED [facility] CLINIC NOTE  PCP: Windell Hasty, DO Cardiology: Dr. Mitzie Anda  78 y.o. with history of HTN and aortic stenosis.  She has had long-standing HTN and is on 3 anti-hypertensive agents with good BP control currently.  She has been found to have mild aortic stenosis by serial echoes that has remained relatively stable over several years.  Calcium  score in 3/23 showed 43.6 Agatston units (42nd percentile).   Today she returns for AHF follow up. Overall feeling great. Denies palpitations, CP, dizziness, edema, or PND/Orthopnea. No SOB. Appetite ok. No fever or chills. Weight at home 151 pounds. Taking all medications. Denies ETOH, tobacco or drug use. SBP at home usually 120-125. Able to get around and do ADLs, occasionally (but very rarely) is she limited by knee pain.   ECG (personally reviewed): SB 50s  PMH: 1. HTN 2. Aortic stenosis: Echo (2/23) with EF 60-65%, mild LVH, normal RV, mild AS with mean gradient 17 mmHg.  3. GERD 4. H/o GI bleeding 5. Anxiety 6. Hyperlipidemia 7. Bilateral THR 8. CAD: Calcium  score scan 3/23 with 43.6 AU (42nd percentile).  9. ABIs (12/19): No evidence for PAD.   SH: No smoking or ETOH, married, lives in [place], 2 children.   FH: Father with "valve replacement," aunt with "valve replacement," mother with HTN.   ROS: All systems reviewed and negative except as per HPI.   Current Outpatient Medications  Medication Sig Dispense Refill   alendronate (FOSAMAX) 70 MG tablet 1 tablet 30 minutes before the first food, beverage or medicine of the day with plain water     amLODipine  (NORVASC ) 10 MG tablet TAKE 1 TABLET BY MOUTH EVERY DAY 90 tablet 0   b complex vitamins capsule Take 1 capsule by mouth daily.     BIOTIN 5000 PO Take 1 capsule by mouth every morning.      buPROPion (WELLBUTRIN XL) 150 MG 24 hr tablet Take 150 mg by mouth daily.     Calcium  Carbonate-Vitamin D  600-200 MG-UNIT TABS Take 1 tablet 2 (two) times daily by mouth.       carvedilol  (COREG ) 25 MG tablet TAKE 1 TABLET BY MOUTH TWICE A DAY 180 tablet 0   Coenzyme Q10 300 MG CAPS Take by mouth daily.     cycloSPORINE (RESTASIS) 0.05 % ophthalmic emulsion 1 drop in both eyes     Famotidine (PEPCID PO) Take 1 tablet by mouth as needed.     fluticasone  (FLONASE ) 50 MCG/ACT nasal spray Place 2 sprays as needed into both nostrils for allergies.   5   lisinopril  (PRINIVIL ,ZESTRIL ) 20 MG tablet Take 20 mg by mouth 2 (two) times daily.     Magnesium  250 MG TABS Take 1 tablet by mouth every other day.     Multiple Vitamin (MULTIVITAMIN WITH MINERALS) TABS tablet Take 1 tablet by mouth every morning.      Omega 3 1200 MG CAPS Take 2 capsules by mouth every morning.      rosuvastatin  (CRESTOR ) 10 MG tablet TAKE 1 TABLET BY MOUTH EVERY DAY 90 tablet 0   TURMERIC PO Take 1 capsule by mouth daily. PATIENT NOT SURE OF DOSAGE     vitamin B-12 (CYANOCOBALAMIN ) 500 MCG tablet Take 500 mcg by mouth every morning.      VITAMIN E PO Take 1 capsule by mouth daily. PATIENT NOT SURE OF DOSAGE     No current facility-administered medications for this encounter.   BP 138/70   Pulse (!) 58   Wt  68.7 kg (151 lb 6.4 oz)   SpO2 98%   BMI 29.57 kg/m  Physical exam:  General:  well appearing.  No respiratory difficulty. Walked into clinic.  Neck: supple. JVD flat.  Cor: PMI nondisplaced. Regular rate & rhythm. No rubs, gallops or murmurs. Lungs: clear Extremities: no cyanosis, clubbing, rash, trace ankle edema  Neuro: alert & oriented x 3. Moves all 4 extremities w/o difficulty. Affect pleasant.   Wt Readings from Last 3 Encounters:  10/25/23 68.7 kg (151 lb 6.4 oz)  11/16/22 70 kg (154 lb 6.4 oz)  07/07/21 67.6 kg (149 lb)    Assessment/Plan: 1. Aortic stenosis: Mild with last echo in 2/23 showing mean gradient 17 mmHg.  - Echo today with mild aortic stenosis on quick glance by Dr. Mitzie Anda.  - Repeat echo in >1 yr as aortic stenosis has remained stable.  2. HTN: SBP runs in 120s  at home.  She seems to be reasonably controlled on 3 drug regimen.  - Continue amlodipine , mild ankle edema may be from this.  - Continue lisinopril , check BMET - Continue Coreg .  - Continue activity as tolerated.   3. CAD: Calcium  score 43.6 Agatston units in 3/23, 42nd percentile.  - She is on Crestor . LDL 6/24  4. Surgical Clearance  - Scheduled for knee arthroplasty July 22.  - Echo briefly glanced over today by Dr. Mitzie Anda and aortic stenosis remains mild - RCRI with 3.9 % Risk of major cardiac event - Will need labs prior to surgery. She has upcoming f/u with PCP with plans to check labs then.  - Will forward note to Dr. Marcelle Sergeant office.   Follow-up in 1 year.   Rose Burgess 10/25/2023

## 2023-10-25 ENCOUNTER — Ambulatory Visit (HOSPITAL_BASED_OUTPATIENT_CLINIC_OR_DEPARTMENT_OTHER)
Admission: RE | Admit: 2023-10-25 | Discharge: 2023-10-25 | Disposition: A | Discharge status: Home / Self Care | Source: Ambulatory Visit | Attending: Cardiology | Admitting: Cardiology

## 2023-10-25 ENCOUNTER — Ambulatory Visit (HOSPITAL_COMMUNITY)
Admission: RE | Admit: 2023-10-25 | Discharge: 2023-10-25 | Disposition: A | Discharge status: Home / Self Care | Source: Ambulatory Visit | Attending: Cardiology | Admitting: Cardiology

## 2023-10-25 ENCOUNTER — Encounter (HOSPITAL_COMMUNITY): Payer: Self-pay

## 2023-10-25 ENCOUNTER — Ambulatory Visit (HOSPITAL_COMMUNITY): Payer: Self-pay | Admitting: Cardiology

## 2023-10-25 VITALS — BP 138/70 | HR 58 | Wt 151.4 lb

## 2023-10-25 DIAGNOSIS — I10 Essential (primary) hypertension: Secondary | ICD-10-CM

## 2023-10-25 DIAGNOSIS — I350 Nonrheumatic aortic (valve) stenosis: Secondary | ICD-10-CM

## 2023-10-25 DIAGNOSIS — Z01818 Encounter for other preprocedural examination: Secondary | ICD-10-CM | POA: Diagnosis not present

## 2023-10-25 DIAGNOSIS — I080 Rheumatic disorders of both mitral and aortic valves: Secondary | ICD-10-CM | POA: Diagnosis not present

## 2023-10-25 DIAGNOSIS — Z79899 Other long term (current) drug therapy: Secondary | ICD-10-CM | POA: Diagnosis not present

## 2023-10-25 DIAGNOSIS — I2510 Atherosclerotic heart disease of native coronary artery without angina pectoris: Secondary | ICD-10-CM

## 2023-10-25 LAB — ECHOCARDIOGRAM COMPLETE
AR max vel: 1.66 cm2
AV Area VTI: 1.52 cm2
AV Area mean vel: 1.59 cm2
AV Mean grad: 12.5 mmHg
AV Peak grad: 23.1 mmHg
Ao pk vel: 2.41 m/s
Area-P 1/2: 4.24 cm2
S' Lateral: 2.7 cm

## 2023-10-25 NOTE — Progress Notes (Signed)
 Office note faxed to Dr. Claiborne Crew 's office as requested. Confirmation received

## 2023-10-25 NOTE — Patient Instructions (Signed)
 Good to see you today!  No medication changes were made   Your physician recommends that you schedule a follow-up appointment  1 year ( June 2026) Call office in April  to schedule an appointment  If you have any questions or concerns before your next appointment please send us  a message through [place] or call our office at [phone].    TO LEAVE A MESSAGE FOR THE NURSE SELECT OPTION 2, PLEASE LEAVE A MESSAGE INCLUDING: YOUR NAME DATE OF BIRTH CALL BACK NUMBER REASON FOR CALL**this is important as we prioritize the call backs  YOU WILL RECEIVE A CALL BACK THE SAME DAY AS LONG AS YOU CALL BEFORE 4:00 PM At the [facility], you and your health needs are our priority. As part of our continuing mission to provide you with exceptional heart care, we have created designated Provider Care Teams. These Care Teams include your primary Cardiologist (physician) and Advanced Practice Providers (APPs- Physician Assistants and Nurse Practitioners) who all work together to provide you with the care you need, when you need it.   You may see any of the following providers on your designated Care Team at your next follow up: Dr Jules Oar Dr Peder Bourdon Dr. Alwin Baars Dr. Arta Lark Amy Marijane Shoulders, NP Ruddy Corral, [place] [facility] [place], [place] Dennise Fitz, NP [place] Lee, NP Shawnee Dellen, NP Luster Salters, PharmD Bevely Brush, PharmD   Please be sure to bring in all your medications bottles to every appointment.    Thank you for choosing [facility]

## 2023-10-25 NOTE — Addendum Note (Signed)
 Encounter addended by: Shawne Bulow M, RN on: 10/25/2023 4:14 PM  Actions taken: Clinical Note Signed

## 2023-11-07 DIAGNOSIS — M542 Cervicalgia: Secondary | ICD-10-CM | POA: Diagnosis not present

## 2023-11-07 DIAGNOSIS — I700 Atherosclerosis of aorta: Secondary | ICD-10-CM | POA: Diagnosis not present

## 2023-11-07 DIAGNOSIS — I350 Nonrheumatic aortic (valve) stenosis: Secondary | ICD-10-CM | POA: Diagnosis not present

## 2023-11-07 DIAGNOSIS — E785 Hyperlipidemia, unspecified: Secondary | ICD-10-CM | POA: Diagnosis not present

## 2023-11-07 DIAGNOSIS — K219 Gastro-esophageal reflux disease without esophagitis: Secondary | ICD-10-CM | POA: Diagnosis not present

## 2023-11-07 DIAGNOSIS — I10 Essential (primary) hypertension: Secondary | ICD-10-CM | POA: Diagnosis not present

## 2023-11-07 DIAGNOSIS — M810 Age-related osteoporosis without current pathological fracture: Secondary | ICD-10-CM | POA: Diagnosis not present

## 2023-11-07 DIAGNOSIS — F419 Anxiety disorder, unspecified: Secondary | ICD-10-CM | POA: Diagnosis not present

## 2023-11-08 DIAGNOSIS — H04123 Dry eye syndrome of bilateral lacrimal glands: Secondary | ICD-10-CM | POA: Diagnosis not present

## 2023-11-08 DIAGNOSIS — H26493 Other secondary cataract, bilateral: Secondary | ICD-10-CM | POA: Diagnosis not present

## 2023-11-08 DIAGNOSIS — H524 Presbyopia: Secondary | ICD-10-CM | POA: Diagnosis not present

## 2023-11-16 ENCOUNTER — Other Ambulatory Visit (HOSPITAL_BASED_OUTPATIENT_CLINIC_OR_DEPARTMENT_OTHER): Payer: Self-pay | Admitting: Cardiovascular Disease

## 2023-11-26 NOTE — Patient Instructions (Signed)
 SURGICAL WAITING ROOM VISITATION  Patients having surgery or a procedure may have no more than 2 support people in the waiting area - these visitors may rotate.    Children under the age of 72 must have an adult with them who is not the patient.  Visitors with respiratory illnesses are discouraged from visiting and should remain at home.  If the patient needs to stay at the hospital during part of their recovery, the visitor guidelines for inpatient rooms apply. Pre-op nurse will coordinate an appropriate time for 1 support person to accompany patient in pre-op.  This support person may not rotate.    Please refer to the [facility] website for the visitor guidelines for Inpatients (after your surgery is over and you are in a regular room).       Your procedure is scheduled on:  12/11/2023    Report to [facility] Main Entrance    Report to admitting at  0730AM   Call this number if you have problems the morning of surgery [phone]   Do not eat food :After Midnight.   After Midnight you may have the following liquids until _0700____ AM DAY OF SURGERY  Water Non-Citrus Juices (without pulp, NO RED-Apple, White grape, White cranberry) Black Coffee (NO MILK/CREAM OR CREAMERS, sugar ok)  Clear Tea (NO MILK/CREAM OR CREAMERS, sugar ok) regular and decaf                             Plain Jell-O (NO RED)                                           Fruit ices (not with fruit pulp, NO RED)                                     Popsicles (NO RED)                                                               Sports drinks like Gatorade (NO RED)                   The day of surgery:  Drink ONE (1) Pre-Surgery Clear Ensure or G2 at0700 AM  ( have completed by ) the morning of surgery. Drink in one sitting. Do not sip.  This drink was given to you during your hospital  pre-op appointment visit. Nothing else to drink after completing the  Pre-Surgery Clear Ensure or G2.           If you have questions, please contact your surgeon's office.       Oral Hygiene is also important to reduce your risk of infection.                                    Remember - BRUSH YOUR TEETH THE MORNING OF SURGERY WITH YOUR REGULAR TOOTHPASTE  DENTURES WILL BE REMOVED PRIOR TO SURGERY PLEASE DO NOT APPLY Poly grip  OR ADHESIVES!!!   Do NOT smoke after Midnight   Stop all vitamins and herbal supplements 7 days before surgery.   Take these medicines the morning of surgery with A SIP OF WATER:  amlodipine , wellbutrin, coreg , eye drops as usual , pepcid if needed   DO NOT TAKE ANY ORAL DIABETIC MEDICATIONS DAY OF YOUR SURGERY  Bring CPAP mask and tubing day of surgery.                              You may not have any metal on your body including hair pins, jewelry, and body piercing             Do not wear make-up, lotions, powders, perfumes/cologne, or deodorant  Do not wear nail polish including gel and S&S, artificial/acrylic nails, or any other type of covering on natural nails including finger and toenails. If you have artificial nails, gel coating, etc. that needs to be removed by a nail salon please have this removed prior to surgery or surgery may need to be canceled/ delayed if the surgeon/ anesthesia feels like they are unable to be safely monitored.   Do not shave  48 hours prior to surgery.               Men may shave face and neck.   Do not bring valuables to the hospital. [facility] IS NOT             RESPONSIBLE   FOR VALUABLES.   Contacts, glasses, dentures or bridgework may not be worn into surgery.   Bring small overnight bag day of surgery.   DO NOT BRING YOUR HOME MEDICATIONS TO THE HOSPITAL. PHARMACY WILL DISPENSE MEDICATIONS LISTED ON YOUR MEDICATION LIST TO YOU DURING YOUR ADMISSION IN THE HOSPITAL!    Patients discharged on the day of surgery will not be allowed to drive home.  Someone NEEDS to stay with you for the first 24 hours after  anesthesia.   Special Instructions: Bring a copy of your healthcare power of attorney and living will documents the day of surgery if you haven't scanned them before.              Please read over the following fact sheets you were given: IF YOU HAVE QUESTIONS ABOUT YOUR PRE-OP INSTRUCTIONS PLEASE CALL [phone]   If you received a COVID test during your pre-op visit  it is requested that you wear a mask when out in public, stay away from anyone that may not be feeling well and notify your surgeon if you develop symptoms. If you test positive for Covid or have been in contact with anyone that has tested positive in the last 10 days please notify you surgeon.      Pre-operative 5 CHG Bath Instructions   You can play a key role in reducing the risk of infection after surgery. Your skin needs to be as free of germs as possible. You can reduce the number of germs on your skin by washing with CHG (chlorhexidine  gluconate) soap before surgery. CHG is an antiseptic soap that kills germs and continues to kill germs even after washing.   DO NOT use if you have an allergy to chlorhexidine /CHG or antibacterial soaps. If your skin becomes reddened or irritated, stop using the CHG and notify one of our RNs at [phone].   Please shower with the CHG soap starting 4 days before surgery using the following  schedule:     Please keep in mind the following:  DO NOT shave, including legs and underarms, starting the day of your first shower.   You may shave your face at any point before/day of surgery.  Place clean sheets on your bed the day you start using CHG soap. Use a clean washcloth (not used since being washed) for each shower. DO NOT sleep with pets once you start using the CHG.   CHG Shower Instructions:  If you choose to wash your hair and private area, wash first with your normal shampoo/soap.  After you use shampoo/soap, rinse your hair and body thoroughly to remove shampoo/soap residue.   Turn the water OFF and apply about 3 tablespoons (45 ml) of CHG soap to a CLEAN washcloth.  Apply CHG soap ONLY FROM YOUR NECK DOWN TO YOUR TOES (washing for 3-5 minutes)  DO NOT use CHG soap on face, private areas, open wounds, or sores.  Pay special attention to the area where your surgery is being performed.  If you are having back surgery, having someone wash your back for you may be helpful. Wait 2 minutes after CHG soap is applied, then you may rinse off the CHG soap.  Pat dry with a clean towel  Put on clean clothes/pajamas   If you choose to wear lotion, please use ONLY the CHG-compatible lotions on the back of this paper.     Additional instructions for the day of surgery: DO NOT APPLY any lotions, deodorants, cologne, or perfumes.   Put on clean/comfortable clothes.  Brush your teeth.  Ask your nurse before applying any prescription medications to the skin.      CHG Compatible Lotions   Aveeno Moisturizing lotion  Cetaphil Moisturizing Cream  Cetaphil Moisturizing Lotion  Clairol Herbal Essence Moisturizing Lotion, Dry Skin  Clairol Herbal Essence Moisturizing Lotion, Extra Dry Skin  Clairol Herbal Essence Moisturizing Lotion, Normal Skin  Curel Age Defying Therapeutic Moisturizing Lotion with Alpha Hydroxy  Curel Extreme Care Body Lotion  Curel Soothing Hands Moisturizing Hand Lotion  Curel Therapeutic Moisturizing Cream, Fragrance-Free  Curel Therapeutic Moisturizing Lotion, Fragrance-Free  Curel Therapeutic Moisturizing Lotion, Original Formula  Eucerin Daily Replenishing Lotion  Eucerin Dry Skin Therapy Plus Alpha Hydroxy Crme  Eucerin Dry Skin Therapy Plus Alpha Hydroxy Lotion  Eucerin Original Crme  Eucerin Original Lotion  Eucerin Plus Crme Eucerin Plus Lotion  Eucerin TriLipid Replenishing Lotion  Keri Anti-Bacterial Hand Lotion  Keri Deep Conditioning Original Lotion Dry Skin Formula Softly Scented  Keri Deep Conditioning Original Lotion, Fragrance  Free Sensitive Skin Formula  Keri Lotion Fast Absorbing Fragrance Free Sensitive Skin Formula  Keri Lotion Fast Absorbing Softly Scented Dry Skin Formula  Keri Original Lotion  Keri Skin Renewal Lotion Keri Silky Smooth Lotion  Keri Silky Smooth Sensitive Skin Lotion  Nivea Body Creamy Conditioning Oil  Nivea Body Extra Enriched Teacher, adult education Moisturizing Lotion Nivea Crme  Nivea Skin Firming Lotion  NutraDerm 30 Skin Lotion  NutraDerm Skin Lotion  NutraDerm Therapeutic Skin Cream  NutraDerm Therapeutic Skin Lotion  ProShield Protective Hand Cream  Provon moisturizing lotion

## 2023-11-26 NOTE — Progress Notes (Signed)
 Anesthesia Review:  PCP: Cardiologist : [facility]- 10/25/23   PPM/ ICD: Device Orders: Rep Notified:  Chest x-ray : EKG : 10/25/23  Echo : 10/25/23  CT Card- 07/25/21  Stress test: Cardiac Cath :   Activity level:  Sleep Study/ CPAP : Fasting Blood Sugar :      / Checks Blood Sugar -- times a day:    Blood Thinner/ Instructions /Last Dose: ASA / Instructions/ Last Dose :

## 2023-11-28 ENCOUNTER — Other Ambulatory Visit: Payer: Self-pay

## 2023-11-28 ENCOUNTER — Encounter (HOSPITAL_COMMUNITY)
Admission: RE | Admit: 2023-11-28 | Discharge: 2023-11-28 | Disposition: A | Discharge status: Home / Self Care | Source: Ambulatory Visit | Attending: Orthopedic Surgery | Admitting: Orthopedic Surgery

## 2023-11-28 ENCOUNTER — Encounter (HOSPITAL_COMMUNITY): Payer: Self-pay

## 2023-11-28 VITALS — BP 116/68 | HR 56 | Temp 98.6°F | Resp 16 | Ht 60.0 in | Wt 146.0 lb

## 2023-11-28 DIAGNOSIS — I350 Nonrheumatic aortic (valve) stenosis: Secondary | ICD-10-CM | POA: Diagnosis not present

## 2023-11-28 DIAGNOSIS — K219 Gastro-esophageal reflux disease without esophagitis: Secondary | ICD-10-CM | POA: Diagnosis not present

## 2023-11-28 DIAGNOSIS — I2510 Atherosclerotic heart disease of native coronary artery without angina pectoris: Secondary | ICD-10-CM | POA: Insufficient documentation

## 2023-11-28 DIAGNOSIS — M1711 Unilateral primary osteoarthritis, right knee: Secondary | ICD-10-CM | POA: Insufficient documentation

## 2023-11-28 DIAGNOSIS — I10 Essential (primary) hypertension: Secondary | ICD-10-CM | POA: Diagnosis not present

## 2023-11-28 DIAGNOSIS — Z01812 Encounter for preprocedural laboratory examination: Secondary | ICD-10-CM | POA: Diagnosis not present

## 2023-11-28 DIAGNOSIS — Z01818 Encounter for other preprocedural examination: Secondary | ICD-10-CM

## 2023-11-28 LAB — BASIC METABOLIC PANEL WITH GFR
Anion gap: 10 (ref 5–15)
BUN: 26 mg/dL — ABNORMAL HIGH (ref 8–23)
CO2: 24 mmol/L (ref 22–32)
Calcium: 9.5 mg/dL (ref 8.9–10.3)
Chloride: 102 mmol/L (ref 98–111)
Creatinine, Ser: 0.66 mg/dL (ref 0.44–1.00)
GFR, Estimated: >60 mL/min (ref >60)
Glucose, Bld: 99 mg/dL (ref 70–99)
Potassium: 5 mmol/L (ref 3.5–5.1)
Sodium: 136 mmol/L (ref 135–145)

## 2023-11-28 LAB — CBC
HCT: 37.6 % (ref 36.0–46.0)
Hemoglobin: 12.4 g/dL (ref 12.0–15.0)
MCH: 31.7 pg (ref 26.0–34.0)
MCHC: 33 g/dL (ref 30.0–36.0)
MCV: 96.2 fL (ref 80.0–100.0)
Platelets: 203 K/uL (ref 150–400)
RBC: 3.91 MIL/uL (ref 3.87–5.11)
RDW: 13.1 % (ref 11.5–15.5)
WBC: 8 K/uL (ref 4.0–10.5)
nRBC: 0 % (ref 0.0–0.2)

## 2023-11-28 LAB — SURGICAL PCR SCREEN
MRSA, PCR: NEGATIVE
Staphylococcus aureus: NEGATIVE

## 2023-12-03 ENCOUNTER — Encounter (HOSPITAL_COMMUNITY): Payer: Self-pay

## 2023-12-03 NOTE — Progress Notes (Signed)
 Case: [id number] Date/Time: 12/11/23 0945   Procedure: ARTHROPLASTY, KNEE, TOTAL (Right: Knee)   Anesthesia type: Spinal   Diagnosis: Primary osteoarthritis of right knee [M17.11]   Pre-op diagnosis: Right knee osteoarthritis   [facility] ROOM 09 / [facility]   Surgeons: Ernie Cough, MD       DISCUSSION: Rose Burgess is a 78 yo with PMH of HTN, CAD (by CT), mild aortic stenosis, GERD, hiatal hernia, arthritis  Patient is followed by cardiology for history of hypertension, CAD by CT, and mild aortic stenosis by echo.  Last seen in clinic on 10/25/23. Noted noted to be doing well at time.  On medical therapy for her cardiac issues.  Echo in June 2025 shows normal LVEF, mild AI, mild AS with mean gradient of 12.5.  Cleared for surgery:   Surgical Clearance  - Scheduled for knee arthroplasty July 22.  - Echo briefly glanced over today by Dr. Rolan and aortic stenosis remains mild - RCRI with 3.9 % Risk of major cardiac event - Will need labs prior to surgery. She has upcoming f/u with PCP with plans to check labs then.  - Will forward note to Dr. Lyndle office.  VS: BP 116/68   Pulse (!) 56   Temp 37 C (Oral)   Resp 16   Ht 5' (1.524 m)   Wt 66.2 kg   SpO2 98%   BMI 28.51 kg/m   PROVIDERS: Valentin Skates, DO   LABS: Labs reviewed: Acceptable for surgery. (all labs ordered are listed, but only abnormal results are displayed)  Labs Reviewed  BASIC METABOLIC PANEL WITH GFR - Abnormal; Notable for the following components:      Result Value   BUN 26 (*)    All other components within normal limits  SURGICAL PCR SCREEN  CBC     IMAGES:   EKG 10/25/23:  Sinus bradycardia with sinus arrhythmia, rate 54 Otherwise normal ECG  CV: Echo 10/25/2023: IMPRESSIONS    1. Left ventricular ejection fraction, by estimation, is 60 to 65%. The left ventricle has normal function. The left ventricle has no regional wall motion abnormalities. There is mild left ventricular  hypertrophy of the basal-septal segment. Left ventricular diastolic parameters were normal.  2. Right ventricular systolic function is normal. The right ventricular size is normal. Tricuspid regurgitation signal is inadequate for assessing PA pressure.  3. The mitral valve is degenerative. Mild mitral valve regurgitation. No evidence of mitral stenosis. Moderate mitral annular calcification.  4. The aortic valve is calcified. There is severe calcifcation of the aortic valve. There is severe thickening of the aortic valve. Aortic valve regurgitation is mild. Mild aortic valve stenosis. Aortic valve area, by VTI measures 1.52 cm. Aortic valve  mean gradient measures 12.5 mmHg. Aortic valve Vmax measures 2.41 m/s.  5. The inferior vena cava is normal in size with greater than 50% respiratory variability, suggesting right atrial pressure of 3 mmHg.  CT Calcium  score 07/25/2021:   IMPRESSION: 1. Coronary calcium  score of 43.6. This was 42nd percentile for age-, race-, and sex-matched controls. 2. Aortic valve annular and leaflet calcification. 3. Mitral annular calcification. 4. Aortic atherosclerosis. Past Medical History:  Diagnosis Date   Abnormal finding on screening procedure 07/07/2021   Arthritis    Diverticulosis    Essential hypertension 10/17/2015   GERD (gastroesophageal reflux disease)    H/O hiatal hernia    Heart murmur    History of blood transfusion    07/2012 - several blood transfusions per  patient    Hyponatremia    h/o hyponatremia on diuretics   Mild aortic stenosis 03/29/2017   Mean gradient 14 mmHg on echo 02/2017.   Panic attack    Pure hypercholesterolemia 07/07/2021    Past Surgical History:  Procedure Laterality Date   BUNIONECTOMY     CATARACT EXTRACTION Right 2018   colon surgery for devierticulosis   2013   3'14 [place], [place]   TOTAL HIP ARTHROPLASTY Right 09/30/2013   Procedure: RIGHT TOTAL HIP ARTHROPLASTY ANTERIOR APPROACH;  Surgeon: [name], MD;  Location[facility];  Service: Orthopedics;  Laterality: Right;   TOTAL HIP ARTHROPLASTY Left 05/11/2014   Procedure: LEFT TOTAL HIP ARTHROPLASTY ANTERIOR APPROACH;  Surgeon: [name], MD;  [facility];  Service: Orthopedics;  Laterality: Left;   TUBAL LIGATION      MEDICATIONS:  acetaminophen  (TYLENOL ) 650 MG CR tablet   alendronate (FOSAMAX) 70 MG tablet   amLODipine  (NORVASC ) 10 MG tablet   b complex vitamins capsule   BIOTIN 5000 PO   buPROPion (WELLBUTRIN XL) 150 MG 24 hr tablet   carvedilol  (COREG ) 25 MG tablet   Cholecalciferol (VITAMIN D3) 50 MCG (2000 UT) capsule   Coenzyme Q10 100 MG TABS   cycloSPORINE (RESTASIS) 0.05 % ophthalmic emulsion   famotidine (PEPCID) 20 MG tablet   lisinopril  (PRINIVIL ,ZESTRIL ) 20 MG tablet   melatonin 3 MG TABS tablet   Multiple Minerals-Vitamins (CAL-MAG-ZINC-D PO)   Multiple Vitamin (MULTIVITAMIN WITH MINERALS) TABS tablet   Omega 3 1000 MG CAPS   rosuvastatin  (CRESTOR ) 10 MG tablet   vitamin E 180 MG (400 UNITS) capsule   No current facility-administered medications for this encounter.   Burnard CHRISTELLA Odis DEVONNA [facility] [place] Phone [phone] 12/03/2023 8:31 AM

## 2023-12-10 NOTE — H&P (Signed)
 TOTAL KNEE ADMISSION H&P  Patient is being admitted for right total knee arthroplasty.  Therapy Plans: outpatient therapy at [facility] Disposition: Home with son and husband Planned DVT Prophylaxis: aspirin  81mg  BID DME needed: none PCP: Dr. Valentin - verbal clearance, awaiting form Cardio: Dr. Rolan - clearance received TXA: IV Allergies: NKDA Anesthesia Concerns: none BMI: 29.5 Last HgbA1c: Not diabetic   Other: - staying overnight - Borrowing ice machine from neighbor - oxycodone , robaxin , tylenol  - History of THA - did well  Subjective:  Chief Complaint:right knee pain.  HPI: Rose Burgess, 78 y.o. female, has a history of pain and functional disability in the right knee due to arthritis and has failed non-surgical conservative treatments for greater than 12 weeks to includeNSAID's and/or analgesics, corticosteriod injections, and activity modification.  Onset of symptoms was gradual, starting 2 years ago with gradually worsening course since that time. The patient noted no past surgery on the right knee(s).  Patient currently rates pain in the right knee(s) at 8 out of 10 with activity. Patient has worsening of pain with activity and weight bearing and pain that interferes with activities of daily living.  Patient has evidence of joint space narrowing by imaging studies. There is no active infection.  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   Panic attack    Pure hypercholesterolemia 07/07/2021    Past Surgical History:  Procedure Laterality Date   BUNIONECTOMY     CATARACT EXTRACTION Right 2018   colon surgery for devierticulosis   2013   3'14 [place], [place]   TOTAL HIP ARTHROPLASTY Right 09/30/2013   Procedure: RIGHT TOTAL HIP ARTHROPLASTY ANTERIOR APPROACH;  Surgeon: [name], MD;  [facility];  Service: Orthopedics;  Laterality: Right;   TOTAL HIP ARTHROPLASTY Left 05/11/2014   Procedure: LEFT TOTAL HIP ARTHROPLASTY ANTERIOR APPROACH;  Surgeon: [name], MD;  [facility];  Service: Orthopedics;  Laterality: Left;   TUBAL LIGATION      No current facility-administered medications for this encounter.   Current Outpatient Medications  Medication Sig Dispense Refill Last Dose/Taking   acetaminophen  (TYLENOL ) 650 MG CR tablet Take 1,300 mg by mouth 2 (two) times daily.   Taking   alendronate (FOSAMAX) 70 MG tablet 1 tablet 30 minutes before the first food, beverage or medicine of the day with plain water    Taking   amLODipine  (NORVASC ) 10 MG tablet TAKE  1 TABLET BY MOUTH EVERY DAY 90 tablet 0 Taking   b complex vitamins capsule Take 1 capsule by mouth daily.   Taking   BIOTIN 5000 PO Take 1 capsule by mouth every morning.    Taking   buPROPion  (WELLBUTRIN  XL) 150 MG 24 hr tablet Take 150 mg by mouth daily.   Taking   carvedilol  (COREG ) 25 MG tablet TAKE 1 TABLET BY MOUTH TWICE A DAY 180 tablet 0 Taking   Cholecalciferol (VITAMIN D3) 50 MCG (2000 UT) capsule Take 2,000 Units  by mouth daily.   Taking   Coenzyme Q10 100 MG TABS Take 100 mg by mouth daily.   Taking   cycloSPORINE  (RESTASIS ) 0.05 % ophthalmic emulsion Place 1 drop into both eyes 2 (two) times daily.   Taking   famotidine  (PEPCID ) 20 MG tablet Take 20 mg by mouth daily as needed (Heartburn).   Taking As Needed   lisinopril  (PRINIVIL ,ZESTRIL ) 20 MG tablet Take 20 mg by mouth 2 (two) times daily.   Taking   melatonin 3 MG TABS tablet Take 6 mg by mouth at bedtime as needed (Sleep). Gummies   Taking As Needed   Multiple Minerals-Vitamins (CAL-MAG-ZINC-D PO) Take 1 tablet by mouth 2 (two) times daily.   Taking   Multiple Vitamin (MULTIVITAMIN WITH MINERALS) TABS tablet Take 1 tablet by mouth every morning.    Taking   Omega 3 1000 MG CAPS Take 2,000 mg by mouth every morning.   Taking   rosuvastatin  (CRESTOR ) 10 MG tablet TAKE 1 TABLET BY MOUTH EVERY DAY (Patient taking differently: Take 10 mg by mouth at bedtime.) 90 tablet 0 Taking Differently   vitamin E 180 MG (400 UNITS) capsule Take 400 Units by mouth daily.   Taking   No Known Allergies  Social History   Tobacco Use   Smoking status: Never   Smokeless tobacco: Never  Substance Use Topics   Alcohol use: No    Comment: rare    Family History  Problem Relation Age of Onset   Colon cancer Mother    Mitral valve prolapse Father        valve issues with surgery @ 24   Mitral valve prolapse Sister        valve repair @ 60   Mitral valve prolapse Paternal Aunt        required valve surgery     Review of Systems  Constitutional:  Negative for chills and fever.  Respiratory:  Negative for cough and shortness of breath.   Cardiovascular:  Negative for chest pain.  Gastrointestinal:  Negative for nausea and vomiting.  Musculoskeletal:  Positive for arthralgias.     Objective:  Physical Exam Well nourished and well developed. General: Alert and oriented x3, cooperative and pleasant, no acute distress.  Musculoskeletal: Bilateral  Knees: No erythema, warmth, or effusions Generally full ROM Mild medial based joint tenderness Crepitus on ROM  Vital signs in last 24 hours:    Labs:   Estimated body mass index is 28.51 kg/m as calculated from the following:   Height as of 11/28/23: 5' (1.524 m).   Weight as of 11/28/23: 66.2 kg.   Imaging Review Plain radiographs demonstrate severe degenerative joint disease of the right knee(s). The overall alignment isneutral. The bone quality appears to be adequate for age and reported activity level.      Assessment/Plan:  End stage arthritis, right knee   The patient history, physical examination, clinical judgment of the provider and imaging studies  are consistent with end stage degenerative joint disease of the right knee(s) and total knee arthroplasty is deemed medically necessary. The treatment options including medical management, injection therapy arthroscopy and arthroplasty were discussed at length. The risks and benefits of total knee arthroplasty were presented and reviewed. The risks due to aseptic loosening, infection, stiffness, patella tracking problems, thromboembolic complications and other imponderables were discussed. The patient acknowledged the explanation, agreed to proceed with the plan and consent was signed. Patient is being admitted for inpatient treatment for surgery, pain control, PT, OT, prophylactic antibiotics, VTE prophylaxis, progressive ambulation and ADL's and discharge planning. The patient is planning to be discharged home.     Patient's anticipated LOS is less than 2 midnights, meeting these requirements: - Younger than 52 - Lives within 1 hour of care - Has a competent adult at home to recover with post-op recover - NO history of  - Chronic pain requiring opiods  - Diabetes  - Coronary Artery Disease  - Heart failure  - Heart attack  - Stroke  - DVT/VTE  - Cardiac arrhythmia  - Respiratory Failure/COPD  - Renal failure  -  Anemia  - Advanced Liver disease   Rosina Calin, PA-C Orthopedic Surgery [facility] [place] [phone]

## 2023-12-11 ENCOUNTER — Observation Stay (HOSPITAL_COMMUNITY)
Admission: RE | Admit: 2023-12-11 | Discharge: 2023-12-12 | Disposition: A | Discharge status: Home / Self Care | Source: Ambulatory Visit | Attending: Orthopedic Surgery | Admitting: Orthopedic Surgery

## 2023-12-11 ENCOUNTER — Ambulatory Visit (HOSPITAL_COMMUNITY): Admitting: Anesthesiology

## 2023-12-11 ENCOUNTER — Ambulatory Visit (HOSPITAL_COMMUNITY): Payer: Self-pay | Admitting: Medical

## 2023-12-11 ENCOUNTER — Other Ambulatory Visit: Payer: Self-pay

## 2023-12-11 ENCOUNTER — Encounter (HOSPITAL_COMMUNITY): Payer: Self-pay | Admitting: Orthopedic Surgery

## 2023-12-11 ENCOUNTER — Encounter (HOSPITAL_COMMUNITY)
Admission: RE | Disposition: A | Discharge status: Home / Self Care | Payer: Self-pay | Source: Ambulatory Visit | Attending: Orthopedic Surgery

## 2023-12-11 DIAGNOSIS — I10 Essential (primary) hypertension: Secondary | ICD-10-CM

## 2023-12-11 DIAGNOSIS — G8918 Other acute postprocedural pain: Secondary | ICD-10-CM | POA: Diagnosis not present

## 2023-12-11 DIAGNOSIS — Z96651 Presence of right artificial knee joint: Principal | ICD-10-CM

## 2023-12-11 DIAGNOSIS — Z01818 Encounter for other preprocedural examination: Secondary | ICD-10-CM

## 2023-12-11 DIAGNOSIS — E7800 Pure hypercholesterolemia, unspecified: Secondary | ICD-10-CM

## 2023-12-11 DIAGNOSIS — M1711 Unilateral primary osteoarthritis, right knee: Secondary | ICD-10-CM | POA: Diagnosis not present

## 2023-12-11 DIAGNOSIS — Z79899 Other long term (current) drug therapy: Secondary | ICD-10-CM | POA: Diagnosis not present

## 2023-12-11 DIAGNOSIS — Z96643 Presence of artificial hip joint, bilateral: Secondary | ICD-10-CM | POA: Insufficient documentation

## 2023-12-11 DIAGNOSIS — I350 Nonrheumatic aortic (valve) stenosis: Secondary | ICD-10-CM | POA: Diagnosis not present

## 2023-12-11 HISTORY — PX: TOTAL KNEE ARTHROPLASTY: SHX125

## 2023-12-11 SURGERY — ARTHROPLASTY, KNEE, TOTAL
Anesthesia: Monitor Anesthesia Care | Site: Knee | Laterality: Right

## 2023-12-11 MED ORDER — SODIUM CHLORIDE 0.9 % IV SOLN: INTRAVENOUS | Status: DC

## 2023-12-11 MED ORDER — PHENYLEPHRINE HCL (PRESSORS) 10 MG/ML IV SOLN
INTRAVENOUS | Status: AC
  Filled 2023-12-11: qty 1

## 2023-12-11 MED ORDER — METHOCARBAMOL 500 MG PO TABS
500.0000 mg | ORAL_TABLET | Freq: Four times a day (QID) | ORAL | Status: DC | PRN
  Administered 2023-12-11 – 2023-12-12 (×2): 500 mg via ORAL
  Filled 2023-12-11 (×2): qty 1

## 2023-12-11 MED ORDER — BUPIVACAINE IN DEXTROSE 0.75-8.25 % IT SOLN
INTRATHECAL | Status: DC | PRN
Start: 2023-12-11 — End: 2023-12-11
  Administered 2023-12-11: 1.6 mL via INTRATHECAL

## 2023-12-11 MED ORDER — STERILE WATER FOR IRRIGATION IR SOLN
Status: DC | PRN
  Administered 2023-12-11: 2000 mL

## 2023-12-11 MED ORDER — MENTHOL 3 MG MT LOZG: 1.0000 | LOZENGE | OROMUCOSAL | Status: DC | PRN

## 2023-12-11 MED ORDER — PROPOFOL 1000 MG/100ML IV EMUL
INTRAVENOUS | Status: AC
  Filled 2023-12-11: qty 100

## 2023-12-11 MED ORDER — CYCLOSPORINE 0.05 % OP EMUL
1.0000 [drp] | Freq: Two times a day (BID) | OPHTHALMIC | Status: DC
  Administered 2023-12-11 – 2023-12-12 (×2): 1 [drp] via OPHTHALMIC
  Filled 2023-12-11 (×2): qty 30

## 2023-12-11 MED ORDER — BISACODYL 10 MG RE SUPP: 10.0000 mg | Freq: Every day | RECTAL | Status: DC | PRN

## 2023-12-11 MED ORDER — FENTANYL CITRATE PF 50 MCG/ML IJ SOSY: 25.0000 ug | PREFILLED_SYRINGE | INTRAMUSCULAR | Status: DC | PRN

## 2023-12-11 MED ORDER — KETOROLAC TROMETHAMINE 30 MG/ML IJ SOLN
INTRAMUSCULAR | Status: AC
  Filled 2023-12-11: qty 1

## 2023-12-11 MED ORDER — MELATONIN 3 MG PO TABS: 6.0000 mg | ORAL_TABLET | Freq: Every evening | ORAL | Status: DC | PRN

## 2023-12-11 MED ORDER — DEXAMETHASONE SODIUM PHOSPHATE 10 MG/ML IJ SOLN
INTRAMUSCULAR | Status: AC
  Filled 2023-12-11: qty 1

## 2023-12-11 MED ORDER — HYDROMORPHONE HCL 1 MG/ML IJ SOLN: 0.5000 mg | INTRAMUSCULAR | Status: DC | PRN

## 2023-12-11 MED ORDER — METOCLOPRAMIDE HCL 5 MG PO TABS: 5.0000 mg | ORAL_TABLET | Freq: Three times a day (TID) | ORAL | Status: DC | PRN

## 2023-12-11 MED ORDER — TRANEXAMIC ACID-NACL 1000-0.7 MG/100ML-% IV SOLN
1000.0000 mg | INTRAVENOUS | Status: AC
  Administered 2023-12-11: 1000 mg via INTRAVENOUS
  Filled 2023-12-11: qty 100

## 2023-12-11 MED ORDER — FAMOTIDINE 20 MG PO TABS: 20.0000 mg | ORAL_TABLET | Freq: Every day | ORAL | Status: DC | PRN

## 2023-12-11 MED ORDER — ALBUMIN HUMAN 5 % IV SOLN: INTRAVENOUS | Status: DC | PRN

## 2023-12-11 MED ORDER — METHOCARBAMOL 1000 MG/10ML IJ SOLN: 500.0000 mg | Freq: Four times a day (QID) | INTRAMUSCULAR | Status: DC | PRN

## 2023-12-11 MED ORDER — ROPIVACAINE HCL 5 MG/ML IJ SOLN
INTRAMUSCULAR | Status: DC | PRN
Start: 2023-12-11 — End: 2023-12-11
  Administered 2023-12-11: 25 mL via PERINEURAL

## 2023-12-11 MED ORDER — ROSUVASTATIN CALCIUM 10 MG PO TABS
10.0000 mg | ORAL_TABLET | Freq: Every day | ORAL | Status: DC
  Administered 2023-12-11: 10 mg via ORAL
  Filled 2023-12-11: qty 1

## 2023-12-11 MED ORDER — SODIUM CHLORIDE 0.9 % IR SOLN
Status: DC | PRN
Start: 2023-12-11 — End: 2023-12-11
  Administered 2023-12-11: 1000 mL

## 2023-12-11 MED ORDER — PHENYLEPHRINE HCL-NACL 20-0.9 MG/250ML-% IV SOLN
INTRAVENOUS | Status: DC | PRN
Start: 2023-12-11 — End: 2023-12-11
  Administered 2023-12-11: 50 ug/min via INTRAVENOUS

## 2023-12-11 MED ORDER — 0.9 % SODIUM CHLORIDE (POUR BTL) OPTIME
TOPICAL | Status: DC | PRN
  Administered 2023-12-11: 1000 mL

## 2023-12-11 MED ORDER — ACETAMINOPHEN 10 MG/ML IV SOLN: 1000.0000 mg | Freq: Once | INTRAVENOUS | Status: DC | PRN

## 2023-12-11 MED ORDER — PHENYLEPHRINE 80 MCG/ML (10ML) SYRINGE FOR IV PUSH (FOR BLOOD PRESSURE SUPPORT)
PREFILLED_SYRINGE | INTRAVENOUS | Status: AC
  Filled 2023-12-11: qty 20

## 2023-12-11 MED ORDER — SENNA 8.6 MG PO TABS
2.0000 | ORAL_TABLET | Freq: Every day | ORAL | Status: DC
  Administered 2023-12-11: 17.2 mg via ORAL
  Filled 2023-12-11: qty 2

## 2023-12-11 MED ORDER — TRANEXAMIC ACID-NACL 1000-0.7 MG/100ML-% IV SOLN
1000.0000 mg | Freq: Once | INTRAVENOUS | Status: AC
  Administered 2023-12-11: 1000 mg via INTRAVENOUS
  Filled 2023-12-11: qty 100

## 2023-12-11 MED ORDER — FENTANYL CITRATE PF 50 MCG/ML IJ SOSY
50.0000 ug | PREFILLED_SYRINGE | Freq: Once | INTRAMUSCULAR | Status: AC
  Administered 2023-12-11: 50 ug via INTRAVENOUS

## 2023-12-11 MED ORDER — CHLORHEXIDINE GLUCONATE 0.12 % MT SOLN
15.0000 mL | Freq: Once | OROMUCOSAL | Status: AC
  Administered 2023-12-11: 15 mL via OROMUCOSAL

## 2023-12-11 MED ORDER — ASPIRIN 81 MG PO CHEW
81.0000 mg | CHEWABLE_TABLET | Freq: Two times a day (BID) | ORAL | Status: DC
  Administered 2023-12-11 – 2023-12-12 (×2): 81 mg via ORAL
  Filled 2023-12-11 (×2): qty 1

## 2023-12-11 MED ORDER — CARVEDILOL 25 MG PO TABS
25.0000 mg | ORAL_TABLET | Freq: Two times a day (BID) | ORAL | Status: DC
  Administered 2023-12-12: 25 mg via ORAL
  Filled 2023-12-11 (×2): qty 1

## 2023-12-11 MED ORDER — FENTANYL CITRATE (PF) 100 MCG/2ML IJ SOLN
INTRAMUSCULAR | Status: DC | PRN
  Administered 2023-12-11: 50 ug via INTRAVENOUS

## 2023-12-11 MED ORDER — ONDANSETRON HCL 4 MG PO TABS: 4.0000 mg | ORAL_TABLET | Freq: Four times a day (QID) | ORAL | Status: DC | PRN

## 2023-12-11 MED ORDER — LIDOCAINE HCL (PF) 2 % IJ SOLN
INTRAMUSCULAR | Status: DC | PRN
  Administered 2023-12-11: 50 mg via INTRADERMAL

## 2023-12-11 MED ORDER — SODIUM CHLORIDE (PF) 0.9 % IJ SOLN
INTRAMUSCULAR | Status: DC | PRN
  Administered 2023-12-11: 61 mL

## 2023-12-11 MED ORDER — AMLODIPINE BESYLATE 10 MG PO TABS
10.0000 mg | ORAL_TABLET | Freq: Every day | ORAL | Status: DC
  Administered 2023-12-12: 10 mg via ORAL
  Filled 2023-12-11: qty 1

## 2023-12-11 MED ORDER — CEFAZOLIN SODIUM-DEXTROSE 2-4 GM/100ML-% IV SOLN
2.0000 g | Freq: Four times a day (QID) | INTRAVENOUS | Status: AC
  Administered 2023-12-11 (×2): 2 g via INTRAVENOUS
  Filled 2023-12-11 (×2): qty 100

## 2023-12-11 MED ORDER — OXYCODONE HCL 5 MG PO TABS
5.0000 mg | ORAL_TABLET | ORAL | Status: DC | PRN
  Administered 2023-12-11 – 2023-12-12 (×4): 10 mg via ORAL
  Filled 2023-12-11 (×4): qty 2
  Filled 2023-12-11: qty 1

## 2023-12-11 MED ORDER — DEXAMETHASONE SODIUM PHOSPHATE 10 MG/ML IJ SOLN: 8.0000 mg | Freq: Once | INTRAMUSCULAR | Status: DC

## 2023-12-11 MED ORDER — FENTANYL CITRATE (PF) 100 MCG/2ML IJ SOLN
INTRAMUSCULAR | Status: AC
Start: 2023-12-11 — End: 2023-12-11
  Filled 2023-12-11: qty 2

## 2023-12-11 MED ORDER — CEFAZOLIN SODIUM-DEXTROSE 2-4 GM/100ML-% IV SOLN
2.0000 g | INTRAVENOUS | Status: AC
  Administered 2023-12-11: 2 g via INTRAVENOUS
  Filled 2023-12-11: qty 100

## 2023-12-11 MED ORDER — LACTATED RINGERS IV SOLN: INTRAVENOUS | Status: DC | PRN

## 2023-12-11 MED ORDER — DEXMEDETOMIDINE HCL IN NACL 80 MCG/20ML IV SOLN
INTRAVENOUS | Status: AC
Start: 2023-12-11 — End: 2023-12-11
  Filled 2023-12-11: qty 20

## 2023-12-11 MED ORDER — SODIUM CHLORIDE (PF) 0.9 % IJ SOLN
INTRAMUSCULAR | Status: AC
  Filled 2023-12-11: qty 30

## 2023-12-11 MED ORDER — LISINOPRIL 20 MG PO TABS
20.0000 mg | ORAL_TABLET | Freq: Two times a day (BID) | ORAL | Status: DC
  Administered 2023-12-12: 20 mg via ORAL
  Filled 2023-12-11: qty 1

## 2023-12-11 MED ORDER — OXYCODONE HCL 5 MG PO TABS: 10.0000 mg | ORAL_TABLET | ORAL | Status: DC | PRN

## 2023-12-11 MED ORDER — METOCLOPRAMIDE HCL 5 MG/ML IJ SOLN: 5.0000 mg | Freq: Three times a day (TID) | INTRAMUSCULAR | Status: DC | PRN

## 2023-12-11 MED ORDER — LIDOCAINE HCL (PF) 2 % IJ SOLN
INTRAMUSCULAR | Status: AC
  Filled 2023-12-11: qty 5

## 2023-12-11 MED ORDER — BUPIVACAINE-EPINEPHRINE (PF) 0.25% -1:200000 IJ SOLN
INTRAMUSCULAR | Status: AC
  Filled 2023-12-11: qty 30

## 2023-12-11 MED ORDER — ACETAMINOPHEN 500 MG PO TABS
1000.0000 mg | ORAL_TABLET | Freq: Four times a day (QID) | ORAL | Status: DC
  Administered 2023-12-11 – 2023-12-12 (×4): 1000 mg via ORAL
  Filled 2023-12-11 (×4): qty 2

## 2023-12-11 MED ORDER — VASOPRESSIN 20 UNIT/ML IV SOLN
INTRAVENOUS | Status: AC
  Filled 2023-12-11: qty 1

## 2023-12-11 MED ORDER — ONDANSETRON HCL 4 MG/2ML IJ SOLN: 4.0000 mg | Freq: Four times a day (QID) | INTRAMUSCULAR | Status: DC | PRN

## 2023-12-11 MED ORDER — BUPROPION HCL ER (XL) 150 MG PO TB24
150.0000 mg | ORAL_TABLET | Freq: Every day | ORAL | Status: DC
  Administered 2023-12-12: 150 mg via ORAL
  Filled 2023-12-11: qty 1

## 2023-12-11 MED ORDER — DEXAMETHASONE SODIUM PHOSPHATE 10 MG/ML IJ SOLN
INTRAMUSCULAR | Status: DC | PRN
Start: 2023-12-11 — End: 2023-12-11
  Administered 2023-12-11: 10 mg
  Administered 2023-12-11: 8 mg

## 2023-12-11 MED ORDER — DEXAMETHASONE SODIUM PHOSPHATE 10 MG/ML IJ SOLN
10.0000 mg | Freq: Once | INTRAMUSCULAR | Status: AC
  Administered 2023-12-12: 10 mg via INTRAVENOUS
  Filled 2023-12-11: qty 1

## 2023-12-11 MED ORDER — POLYETHYLENE GLYCOL 3350 17 G PO PACK
17.0000 g | PACK | Freq: Two times a day (BID) | ORAL | Status: DC
  Administered 2023-12-11 – 2023-12-12 (×3): 17 g via ORAL
  Filled 2023-12-11 (×3): qty 1

## 2023-12-11 MED ORDER — ORAL CARE MOUTH RINSE: 15.0000 mL | Freq: Once | OROMUCOSAL | Status: AC

## 2023-12-11 MED ORDER — ALUM & MAG HYDROXIDE-SIMETH 200-200-20 MG/5ML PO SUSP: 30.0000 mL | ORAL | Status: DC | PRN

## 2023-12-11 MED ORDER — POVIDONE-IODINE 10 % EX SWAB: 2.0000 | Freq: Once | CUTANEOUS | Status: DC

## 2023-12-11 MED ORDER — PROPOFOL 10 MG/ML IV BOLUS
INTRAVENOUS | Status: DC | PRN
Start: 2023-12-11 — End: 2023-12-11
  Administered 2023-12-11: 30 mg via INTRAVENOUS
  Administered 2023-12-11: 65 ug/kg/min via INTRAVENOUS

## 2023-12-11 MED ORDER — PHENOL 1.4 % MT LIQD
1.0000 | OROMUCOSAL | Status: DC | PRN
Start: 2023-12-11 — End: 2023-12-12

## 2023-12-11 MED ORDER — LACTATED RINGERS IV SOLN: INTRAVENOUS | Status: DC

## 2023-12-11 MED ORDER — FENTANYL CITRATE PF 50 MCG/ML IJ SOSY
PREFILLED_SYRINGE | INTRAMUSCULAR | Status: AC
  Filled 2023-12-11: qty 2

## 2023-12-11 MED ORDER — ONDANSETRON HCL 4 MG/2ML IJ SOLN
INTRAMUSCULAR | Status: DC | PRN
  Administered 2023-12-11: 4 mg via INTRAVENOUS

## 2023-12-11 MED ORDER — ONDANSETRON HCL 4 MG/2ML IJ SOLN
INTRAMUSCULAR | Status: AC
  Filled 2023-12-11: qty 2

## 2023-12-11 MED ORDER — DIPHENHYDRAMINE HCL 12.5 MG/5ML PO ELIX: 12.5000 mg | ORAL_SOLUTION | ORAL | Status: DC | PRN

## 2023-12-11 MED ORDER — DEXMEDETOMIDINE HCL IN NACL 80 MCG/20ML IV SOLN
INTRAVENOUS | Status: DC | PRN
  Administered 2023-12-11 (×2): 4 ug via INTRAVENOUS

## 2023-12-11 SURGICAL SUPPLY — 46 items
ATTUNE MED ANAT PAT 32 KNEE (Knees) IMPLANT
BAG COUNTER SPONGE SURGICOUNT (BAG) IMPLANT
BAG ZIPLOCK 12X15 (MISCELLANEOUS) ×1 IMPLANT
BASEPLATE TIB CMT FB PCKT SZ3 (Knees) IMPLANT
BLADE SAW SGTL 13.0X1.19X90.0M (BLADE) ×1 IMPLANT
BNDG ELASTIC 6INX 5YD STR LF (GAUZE/BANDAGES/DRESSINGS) ×1 IMPLANT
BOWL SMART MIX CTS (DISPOSABLE) ×1 IMPLANT
CEMENT HV SMART SET (Cement) ×2 IMPLANT
COMPONENT FEM CMT ATTUNE 3 RT (Joint) IMPLANT
COVER SURGICAL LIGHT HANDLE (MISCELLANEOUS) ×1 IMPLANT
CUFF TRNQT CYL 34X4.125X (TOURNIQUET CUFF) ×1 IMPLANT
DERMABOND ADVANCED .7 DNX12 (GAUZE/BANDAGES/DRESSINGS) ×1 IMPLANT
DRAPE U-SHAPE 47X51 STRL (DRAPES) ×1 IMPLANT
DRESSING AQUACEL AG SP 3.5X10 (GAUZE/BANDAGES/DRESSINGS) ×1 IMPLANT
DRSG AQUACEL AG ADV 3.5X10 (GAUZE/BANDAGES/DRESSINGS) IMPLANT
DURAPREP 26ML APPLICATOR (WOUND CARE) ×2 IMPLANT
ELECT REM PT RETURN 15FT ADLT (MISCELLANEOUS) ×1 IMPLANT
GLOVE BIO SURGEON STRL SZ 6 (GLOVE) ×1 IMPLANT
GLOVE BIOGEL PI IND STRL 6.5 (GLOVE) ×1 IMPLANT
GLOVE BIOGEL PI IND STRL 7.5 (GLOVE) ×1 IMPLANT
GLOVE ORTHO TXT STRL SZ7.5 (GLOVE) ×2 IMPLANT
GOWN STRL REUS W/ TWL LRG LVL3 (GOWN DISPOSABLE) ×2 IMPLANT
HOLDER FOLEY CATH W/STRAP (MISCELLANEOUS) IMPLANT
INSERT TIB CMT ATTUNE 3X6 RT (Insert) IMPLANT
KIT TURNOVER KIT A (KITS) ×1 IMPLANT
MANIFOLD NEPTUNE II (INSTRUMENTS) ×1 IMPLANT
NDL SAFETY ECLIPSE 18X1.5 (NEEDLE) IMPLANT
NS IRRIG 1000ML POUR BTL (IV SOLUTION) ×1 IMPLANT
PACK TOTAL KNEE CUSTOM (KITS) ×1 IMPLANT
PENCIL SMOKE EVACUATOR (MISCELLANEOUS) ×1 IMPLANT
PIN FIX SIGMA LCS THRD HI (PIN) IMPLANT
PROTECTOR NERVE ULNAR (MISCELLANEOUS) ×1 IMPLANT
SET HNDPC FAN SPRY TIP SCT (DISPOSABLE) ×1 IMPLANT
SET PAD KNEE POSITIONER (MISCELLANEOUS) ×1 IMPLANT
SPIKE FLUID TRANSFER (MISCELLANEOUS) ×2 IMPLANT
SUT MNCRL AB 4-0 PS2 18 (SUTURE) ×1 IMPLANT
SUT STRATAFIX PDS+ 0 24IN (SUTURE) ×1 IMPLANT
SUT VIC AB 1 CT1 36 (SUTURE) ×1 IMPLANT
SUT VIC AB 2-0 CT1 TAPERPNT 27 (SUTURE) ×2 IMPLANT
SYR 3ML LL SCALE MARK (SYRINGE) ×1 IMPLANT
TOWEL GREEN STERILE FF (TOWEL DISPOSABLE) ×1 IMPLANT
TRAY FOLEY MTR SLVR 14FR STAT (SET/KITS/TRAYS/PACK) IMPLANT
TRAY FOLEY MTR SLVR 16FR STAT (SET/KITS/TRAYS/PACK) ×1 IMPLANT
TUBE SUCTION HIGH CAP CLEAR NV (SUCTIONS) ×1 IMPLANT
WATER STERILE IRR 1000ML POUR (IV SOLUTION) ×2 IMPLANT
WRAP KNEE MAXI GEL POST OP (GAUZE/BANDAGES/DRESSINGS) ×1 IMPLANT

## 2023-12-11 NOTE — Anesthesia Preprocedure Evaluation (Signed)
 Anesthesia Evaluation  Patient identified by MRN, date of birth, ID band Patient awake    Reviewed: Allergy & Precautions, NPO status , Patient's Chart, lab work & pertinent test results  Airway Mallampati: II  TM Distance: >3 FB Neck ROM: Full    Dental no notable dental hx.    Pulmonary neg pulmonary ROS   Pulmonary exam normal        Cardiovascular hypertension, Pt. on medications and Pt. on home beta blockers  Rhythm:Regular Rate:Normal  ECHO: IMPRESSIONS    1. Left ventricular ejection fraction, by estimation, is 60 to 65%. The left ventricle has normal function. The left ventricle has no regional wall motion abnormalities. There is mild left ventricular hypertrophy of the basal-septal segment. Left ventricular diastolic parameters were normal.  2. Right ventricular systolic function is normal. The right ventricular size is normal. Tricuspid regurgitation signal is inadequate for assessing PA pressure.  3. The mitral valve is degenerative. Mild mitral valve regurgitation. No evidence of mitral stenosis. Moderate mitral annular calcification.  4. The aortic valve is calcified. There is severe calcifcation of the aortic valve. There is severe thickening of the aortic valve. Aortic valve regurgitation is mild. Mild aortic valve stenosis. Aortic valve area, by VTI measures 1.52 cm. Aortic valve  mean gradient measures 12.5 mmHg. Aortic valve Vmax measures 2.41 m/s.  5. The inferior vena cava is normal in size with greater than 50% respiratory variability, suggesting right atrial pressure of 3 mmHg.    Neuro/Psych   Anxiety     negative neurological ROS     GI/Hepatic Neg liver ROS, hiatal hernia,GERD  ,,  Endo/Other  negative endocrine ROS    Renal/GU negative Renal ROS  negative genitourinary   Musculoskeletal  (+) Arthritis , Osteoarthritis,    Abdominal Normal abdominal exam  (+)   Peds  Hematology  (+) Blood  dyscrasia, anemia Lab Results      Component                Value               Date                      WBC                      8.0                 11/28/2023                HGB                      12.4                11/28/2023                HCT                      37.6                11/28/2023                MCV                      96.2                11/28/2023  PLT                      203                 11/28/2023              Anesthesia Other Findings   Reproductive/Obstetrics                              Anesthesia Physical Anesthesia Plan  ASA: 3  Anesthesia Plan: MAC, Spinal and Regional   Post-op Pain Management: Regional block*   Induction: Intravenous  PONV Risk Score and Plan: 2 and Ondansetron , Dexamethasone , Treatment may vary due to age or medical condition and Propofol  infusion  Airway Management Planned: Simple Face Mask and Nasal Cannula  Additional Equipment: None  Intra-op Plan:   Post-operative Plan:   Informed Consent: I have reviewed the patients History and Physical, chart, labs and discussed the procedure including the risks, benefits and alternatives for the proposed anesthesia with the patient or authorized representative who has indicated his/her understanding and acceptance.     Dental advisory given  Plan Discussed with: CRNA  Anesthesia Plan Comments:         Anesthesia Quick Evaluation

## 2023-12-11 NOTE — Evaluation (Signed)
 Physical Therapy Evaluation Patient Details Name: Rose Burgess MRN: [id number] DOB: 1945-09-06 Today's Date: 12/11/2023  History of Present Illness  78 yo female presents to therapy s/p R TKA on 12/11/2023 due to failure of conservative measures. Pt PMH includes but is not limited to: HLD, hiatal hernia, diverticulosis, arthritis, acid reflux, GERD, chronic pain, B THA, and HTN.  Clinical Impression    Rose Burgess is a 78 y.o. female POD 0 s/p R TKA. Patient reports IND with mobility at baseline. Patient is now limited by functional impairments (see PT problem list below) and requires S for bed mobility and CGA and cues for transfers. Patient was able to ambulate 60 feet with RW and CGA level of assist. Patient instructed in exercise to facilitate ROM and circulation to manage edema.  Patient will benefit from continued skilled PT interventions to address impairments and progress towards PLOF. Acute PT will follow to progress mobility and stair training in preparation for safe discharge home with family support and OPPT services.       If plan is discharge home, recommend the following: A little help with walking and/or transfers;A little help with bathing/dressing/bathroom;Assistance with cooking/housework;Assist for transportation;Help with stairs or ramp for entrance   Can travel by private vehicle        Equipment Recommendations None recommended by PT  Recommendations for Other Services       Functional Status Assessment Patient has had a recent decline in their functional status and demonstrates the ability to make significant improvements in function in a reasonable and predictable amount of time.     Precautions / Restrictions Precautions Precautions: Fall;Knee Restrictions Weight Bearing Restrictions Per Provider Order: No      Mobility  Bed Mobility Overal bed mobility: Needs Assistance Bed Mobility: Supine to Sit     Supine to sit: Supervision, HOB elevated      General bed mobility comments: min cues    Transfers Overall transfer level: Needs assistance Equipment used: Rolling walker (2 wheels) Transfers: Sit to/from Stand Sit to Stand: Contact guard assist           General transfer comment: min cues    Ambulation/Gait Ambulation/Gait assistance: Contact guard assist Gait Distance (Feet): 60 Feet Assistive device: Rolling walker (2 wheels) Gait Pattern/deviations: Step-to pattern, Antalgic, Decreased stance time - right, Trunk flexed Gait velocity: decreased     General Gait Details: slight trunk flexion with B UE support at RW, min cues for safety, posture sequencing and RW management  Stairs            Wheelchair Mobility     Tilt Bed    Modified Rankin (Stroke Patients Only)       Balance Overall balance assessment: Needs assistance Sitting-balance support: Feet supported Sitting balance-Leahy Scale: Good     Standing balance support: Bilateral upper extremity supported, During functional activity, Reliant on assistive device for balance Standing balance-Leahy Scale: Fair                               Pertinent Vitals/Pain Pain Assessment Pain Assessment: 0-10 Pain Score: 5  Pain Location: R knee and LE Pain Descriptors / Indicators: Aching, Constant, Discomfort, Grimacing, Dull, Tightness, Operative site guarding Pain Intervention(s): Limited activity within patient's tolerance, Monitored during session, Premedicated before session, Repositioned, Ice applied    Home Living Family/patient expects to be discharged to:: Private residence Living Arrangements: Spouse/significant other Available Help at Discharge:  Family Type of Home: House Home Access: Stairs to enter Entrance Stairs-Rails: None Entrance Stairs-Number of Steps: 1 Alternate Level Stairs-Number of Steps: flight Home Layout: Two level;Able to live on main level with bedroom/bathroom Home Equipment: Rolling Walker (2  wheels);BSC/3in1;Tub bench;Cane - single point      Prior Function Prior Level of Function : Independent/Modified Independent             Mobility Comments: IND no AD for all ADLs, self care tasks and IADLs       Extremity/Trunk Assessment        Lower Extremity Assessment Lower Extremity Assessment: RLE deficits/detail RLE Deficits / Details: ankle DF/PF 5/5; SLR < 10 degree lag RLE Sensation: decreased light touch    Cervical / Trunk Assessment Cervical / Trunk Assessment: Normal  Communication   Communication Communication: No apparent difficulties    Cognition Arousal: Alert Behavior During Therapy: WFL for tasks assessed/performed   PT - Cognitive impairments: No apparent impairments                         Following commands: Intact       Cueing       General Comments      Exercises Total Joint Exercises Ankle Circles/Pumps: AROM, Both, 10 reps   Assessment/Plan    PT Assessment Patient needs continued PT services  PT Problem List Decreased strength;Decreased range of motion;Decreased activity tolerance;Decreased balance;Decreased mobility;Decreased coordination;Pain       PT Treatment Interventions DME instruction;Gait training;Stair training;Functional mobility training;Therapeutic activities;Therapeutic exercise;Balance training;Neuromuscular re-education;Patient/family education;Modalities    PT Goals (Current goals can be found in the Care Plan section)  Acute Rehab PT Goals Patient Stated Goal: to get stronger and go home and do PT PT Goal Formulation: With patient Time For Goal Achievement: 12/25/23 Potential to Achieve Goals: Good    Frequency 7X/week     Co-evaluation               AM-PAC PT 6 Clicks Mobility  Outcome Measure Help needed turning from your back to your side while in a flat bed without using bedrails?: None Help needed moving from lying on your back to sitting on the side of a flat bed without  using bedrails?: A Little Help needed moving to and from a bed to a chair (including a wheelchair)?: A Little Help needed standing up from a chair using your arms (e.g., wheelchair or bedside chair)?: A Little Help needed to walk in hospital room?: A Little Help needed climbing 3-5 steps with a railing? : A Lot 6 Click Score: 18    End of Session         PT Visit Diagnosis: Unsteadiness on feet (R26.81);Other abnormalities of gait and mobility (R26.89);Muscle weakness (generalized) (M62.81);Difficulty in walking, not elsewhere classified (R26.2);Pain Pain - Right/Left: Right Pain - part of body: Leg;Knee    Time: 8454-8384 PT Time Calculation (min) (ACUTE ONLY): 30 min   Charges:   PT Evaluation $PT Eval Low Complexity: 1 Low PT Treatments $Gait Training: 8-22 mins PT General Charges $$ ACUTE PT VISIT: 1 Visit         [place], PT Acute Rehab   [place] VEAR Drone 12/11/2023, 4:41 PM

## 2023-12-11 NOTE — Anesthesia Postprocedure Evaluation (Signed)
 Anesthesia Post Note  Patient: Rose Burgess  Procedure(s) Performed: ARTHROPLASTY, KNEE, TOTAL (Right: Knee)     Patient location during evaluation: PACU Anesthesia Type: Regional, MAC and Spinal Level of consciousness: awake and alert Pain management: pain level controlled Vital Signs Assessment: post-procedure vital signs reviewed and stable Respiratory status: spontaneous breathing, nonlabored ventilation, respiratory function stable and patient connected to nasal cannula oxygen Cardiovascular status: stable and blood pressure returned to baseline Postop Assessment: no apparent nausea or vomiting Anesthetic complications: no   No notable events documented.  Last Vitals:  Vitals:   12/11/23 1300 12/11/23 1352  BP: (!) 111/51 (!) 122/56  Pulse: (!) 48 (!) 58  Resp: 14 20  Temp: 36.5 C 36.4 C  SpO2: 100% 100%    Last Pain:  Vitals:   12/11/23 1529  TempSrc:   PainSc: 2                  Cordella SQUIBB Brendin Situ

## 2023-12-11 NOTE — Op Note (Signed)
 NAME:  Rose Burgess                      MEDICAL RECORD NO.:  [id number]                             FACILITY:  [facility]      PHYSICIAN:  Donnice BIRCH. Ernie, M.D.  DATE OF BIRTH:  September 02, 1945      DATE OF PROCEDURE:  12/11/2023                                     OPERATIVE REPORT         PREOPERATIVE DIAGNOSIS:  Right knee osteoarthritis.      POSTOPERATIVE DIAGNOSIS:  Right knee osteoarthritis.      FINDINGS:  The patient was noted to have complete loss of cartilage and   bone-on-bone arthritis with associated osteophytes in the medial and patellofemoral compartments of   the knee with a significant synovitis and associated effusion.  The patient had failed months of conservative treatment including medications, injection therapy, activity modification.     PROCEDURE:  Right total knee replacement.      COMPONENTS USED:  DePuy Attune FB CR MS knee   system, a size 3 femur, 3 tibia, size 6 mm CR MS AOX insert, and 32 anatomic patellar   button.      SURGEON:  Donnice BIRCH. Ernie, M.D.      ASSISTANT:  Rosina Calin, PA-C.      ANESTHESIA:  Regional and Spinal.      SPECIMENS:  None.      COMPLICATION:  None.      DRAINS:  None.  EBL: <100 cc      TOURNIQUET TIME:  26 min at 225 mmHg     The patient was stable to the recovery room.      INDICATION FOR PROCEDURE:  Rose Burgess is a 78 y.o. female patient of   mine.  The patient had been seen, evaluated, and treated for months conservatively in the   office with medication, activity modification, and injections.  The patient had   radiographic changes of bone-on-bone arthritis with endplate sclerosis and osteophytes noted.  Based on the radiographic changes and failed conservative measures, the patient   decided to proceed with definitive treatment, total knee replacement.  Risks of infection, DVT, component failure, need for revision surgery, neurovascular injury were reviewed in the office setting.  The postop course was  reviewed stressing the efforts to maximize post-operative satisfaction and function.  Consent was obtained for benefit of pain   relief.      PROCEDURE IN DETAIL:  The patient was brought to the operative theater.   Once adequate anesthesia, preoperative antibiotics, 2 gm of Ancef ,1 gm of Tranexamic Acid , and 10 mg of Decadron  administered, the patient was positioned supine with a right thigh tourniquet placed.  The  right lower extremity was prepped and draped in sterile fashion.  A time-   out was performed identifying the patient, planned procedure, and the appropriate extremity.      The right lower extremity was placed in the [facility] leg holder.  The leg was   exsanguinated, tourniquet elevated to 225 mmHg.  A midline incision was   made followed by median parapatellar arthrotomy.  Following initial   exposure, attention was  first directed to the patella.  Precut   measurement was noted to be 19 mm.  I resected down to 13 mm and used a   32 anatomic patellar button to restore patellar height as well as cover the cut surface.      The lug holes were drilled and a metal shim was placed to protect the   patella from retractors and saw blade during the procedure.      At this point, attention was now directed to the femur.  The femoral   canal was opened with a drill, irrigated to try to prevent fat emboli.  An   intramedullary rod was passed at 3 degrees valgus, 9 mm of bone was   resected off the distal femur.  Following this resection, the tibia was   subluxated anteriorly.  Using the extramedullary guide, 2 mm of bone was resected off   the proximal medial tibia.  We confirmed the gap would be   stable medially and laterally with a size 5 spacer block as well as confirmed that the tibial cut was perpendicular in the coronal plane, checking with an alignment rod.      Once this was done, I sized the femur to be a size 3 in the anterior-   posterior dimension, chose a standard component  based on medial and   lateral dimension.  The size 3 rotation block was then pinned in   position anterior referenced using the C-clamp to set rotation.  The   anterior, posterior, and  chamfer cuts were made without difficulty nor   notching making certain that I was along the anterior cortex to help   with flexion gap stability.      The final box cut was made off the lateral aspect of distal femur.      At this point, the tibia was sized to be a size 3.  The size 3 tray was   then pinned in position through the medial third of the tubercle,   drilled, and keel punched.  Trial reduction was now carried with a 3 femur,  3 tibia, a size 6 mm CR MS insert, and the 32 anatomic patella botton.  The knee was brought to full extension with good flexion stability with the patella   tracking through the trochlea without application of pressure.  Given   all these findings the trial components removed.  Final components were   opened and cement was mixed.  The knee was irrigated with normal saline solution and pulse lavage.  The synovial lining was   then injected with 30 cc of 0.25% Marcaine  with epinephrine , 1 cc of Toradol  and 30 cc of NS for a total of 61 cc.     Final implants were then cemented onto cleaned and dried cut surfaces of bone with the knee brought to extension with a size 6 mm CR MS trial insert.      Once the cement had fully cured, excess cement was removed   throughout the knee.  I confirmed that I was satisfied with the range of   motion and stability, and the final size 6 mm CR MS AOX insert was chosen.  It was   placed into the knee.      The tourniquet had been let down at 26 minutes.  No significant   hemostasis was required.  The extensor mechanism was then reapproximated using #1 Vicryl and #1 Stratafix sutures with the knee   in flexion.  The   remaining wound was closed with 2-0 Vicryl and running 4-0 Monocryl.   The knee was cleaned, dried, dressed sterilely using  Dermabond and   Aquacel dressing.  The patient was then   brought to recovery room in stable condition, tolerating the procedure   well.   Please note that Physician Assistant, Rosina Calin, PA-C was present for the entirety of the case, and was utilized for pre-operative positioning, peri-operative retractor management, general facilitation of the procedure and for primary wound closure at the end of the case.              Donnice CORDOBA Ernie, M.D.    12/11/2023 8:32 AM

## 2023-12-11 NOTE — Transfer of Care (Signed)
 Immediate Anesthesia Transfer of Care Note  Patient: Rose Burgess  Procedure(s) Performed: ARTHROPLASTY, KNEE, TOTAL (Right: Knee)  Patient Location: PACU  Anesthesia Type:Spinal  Level of Consciousness: awake, alert , and oriented  Airway & Oxygen Therapy: Patient Spontanous Breathing and Patient connected to face mask oxygen  Post-op Assessment: Report given to RN and Post -op Vital signs reviewed and stable  Post vital signs: Reviewed and stable  Last Vitals:  Vitals Value Taken Time  BP    Temp    Pulse 61 12/11/23 11:10  Resp 16 12/11/23 11:10  SpO2 91 % 12/11/23 11:10  Vitals shown include unfiled device data.  Last Pain:  Vitals:   12/11/23 0855  TempSrc:   PainSc: Asleep         Complications: No notable events documented.

## 2023-12-11 NOTE — Plan of Care (Signed)

## 2023-12-11 NOTE — Anesthesia Procedure Notes (Signed)
 Procedure Name: MAC Date/Time: 12/11/2023 9:46 AM  Performed by: Obadiah Reyes BROCKS, CRNAPre-anesthesia Checklist: Patient identified, Emergency Drugs available, Suction available, Patient being monitored and Timeout performed Patient Re-evaluated:Patient Re-evaluated prior to induction Oxygen Delivery Method: Simple face mask Preoxygenation: Pre-oxygenation with 100% oxygen Induction Type: IV induction Airway Equipment and Method: Oral airway

## 2023-12-11 NOTE — Discharge Instructions (Signed)

## 2023-12-11 NOTE — Care Plan (Signed)
 Ortho Bundle Case Management Note  Patient Details  Name: Rose Burgess MRN: [id number] Date of Birth: 1945/10/11  R TKA on 12/11/23  DCP: Home with husband and son DME: No needs. Has RW. PT: [facility]                   DME Arranged:  N/A DME Agency:  NA  HH Arranged:    HH Agency:     Additional Comments: Please contact me with any questions of if this plan should need to change.  Burnard Dross, Case Manager [facility] [phone]  Ext. [phone]   12/11/2023, 11:59 AM

## 2023-12-11 NOTE — Anesthesia Procedure Notes (Signed)
 Spinal  Patient location during procedure: OR Start time: 12/11/2023 9:44 AM End time: 12/11/2023 9:46 AM Staffing Performed: anesthesiologist  Anesthesiologist: Dorethea Cordella SQUIBB, DO Performed by: Dorethea Cordella SQUIBB, DO Authorized by: Dorethea Cordella SQUIBB, DO   Preanesthetic Checklist Completed: patient identified, IV checked, site marked, risks and benefits discussed, surgical consent, monitors and equipment checked, pre-op evaluation and timeout performed Spinal Block Patient position: sitting Prep: DuraPrep Patient monitoring: heart rate, cardiac monitor, continuous pulse ox and blood pressure Approach: midline Location: L3-4 Injection technique: single-shot Needle Needle type: Pencan  Needle gauge: 24 G Needle length: 10 cm Assessment Events: CSF return Additional Notes Patient identified. Risks/Benefits/Options discussed with patient including but not limited to bleeding, infection, nerve damage, paralysis, failed block, incomplete pain control, headache, blood pressure changes, nausea, vomiting, reactions to medications, itching and postpartum back pain. Confirmed with bedside nurse the patient's most recent platelet count. Confirmed with patient that they are not currently taking any anticoagulation, have any bleeding history or any family history of bleeding disorders. Patient expressed understanding and wished to proceed. All questions were answered. Sterile technique was used throughout the entire procedure. Please see nursing notes for vital signs. Warning signs of high block given to the patient including shortness of breath, tingling/numbness in hands, complete motor block, or any concerning symptoms with instructions to call for help. Patient was given instructions on fall risk and not to get out of bed. All questions and concerns addressed with instructions to call with any issues or inadequate analgesia.

## 2023-12-11 NOTE — Anesthesia Procedure Notes (Signed)
 Anesthesia Regional Block: Adductor canal block   Pre-Anesthetic Checklist: , timeout performed,  Correct Patient, Correct Site, Correct Laterality,  Correct Procedure, Correct Position, site marked,  Risks and benefits discussed,  Surgical consent,  Pre-op evaluation,  At surgeon's request and post-op pain management  Laterality: Right  Prep: Dura Prep       Needles:  Injection technique: Single-shot  Needle Type: Echogenic Stimulator Needle     Needle Length: 10cm  Needle Gauge: 20     Additional Needles:   Procedures:,,,, ultrasound used (permanent image in chart),,    Narrative:  Start time: 12/11/2023 8:35 AM End time: 12/11/2023 8:40 AM Injection made incrementally with aspirations every 5 mL.  Performed by: Personally  Anesthesiologist: Dorethea Cordella SQUIBB, DO  Additional Notes: Patient identified. Risks/Benefits/Options discussed with patient including but not limited to bleeding, infection, nerve damage, failed block, incomplete pain control. Patient expressed understanding and wished to proceed. All questions were answered. Sterile technique was used throughout the entire procedure. Please see nursing notes for vital signs. Aspirated in 5cc intervals with injection for negative confirmation. Patient was given instructions on fall risk and not to get out of bed. All questions and concerns addressed with instructions to call with any issues or inadequate analgesia.

## 2023-12-11 NOTE — Interval H&P Note (Signed)
 History and Physical Interval Note:  12/11/2023 8:32 AM  Rose Burgess  has presented today for surgery, with the diagnosis of Right knee osteoarthritis.  The various methods of treatment have been discussed with the patient and family. After consideration of risks, benefits and other options for treatment, the patient has consented to  Procedure(s): ARTHROPLASTY, KNEE, TOTAL (Right) as a surgical intervention.  The patient's history has been reviewed, patient examined, no change in status, stable for surgery.  I have reviewed the patient's chart and labs.  Questions were answered to the patient's satisfaction.     [name]

## 2023-12-12 DIAGNOSIS — Z96643 Presence of artificial hip joint, bilateral: Secondary | ICD-10-CM | POA: Diagnosis not present

## 2023-12-12 DIAGNOSIS — M1711 Unilateral primary osteoarthritis, right knee: Secondary | ICD-10-CM | POA: Diagnosis not present

## 2023-12-12 DIAGNOSIS — I10 Essential (primary) hypertension: Secondary | ICD-10-CM | POA: Diagnosis not present

## 2023-12-12 DIAGNOSIS — Z79899 Other long term (current) drug therapy: Secondary | ICD-10-CM | POA: Diagnosis not present

## 2023-12-12 LAB — BASIC METABOLIC PANEL WITH GFR
Anion gap: 8 (ref 5–15)
BUN: 16 mg/dL (ref 8–23)
CO2: 23 mmol/L (ref 22–32)
Calcium: 8.4 mg/dL — ABNORMAL LOW (ref 8.9–10.3)
Chloride: 98 mmol/L (ref 98–111)
Creatinine, Ser: 0.73 mg/dL (ref 0.44–1.00)
GFR, Estimated: >60 mL/min (ref >60)
Glucose, Bld: 152 mg/dL — ABNORMAL HIGH (ref 70–99)
Potassium: 4.6 mmol/L (ref 3.5–5.1)
Sodium: 129 mmol/L — ABNORMAL LOW (ref 135–145)

## 2023-12-12 LAB — CBC
HCT: 31.1 % — ABNORMAL LOW (ref 36.0–46.0)
Hemoglobin: 10.4 g/dL — ABNORMAL LOW (ref 12.0–15.0)
MCH: 31.8 pg (ref 26.0–34.0)
MCHC: 33.4 g/dL (ref 30.0–36.0)
MCV: 95.1 fL (ref 80.0–100.0)
Platelets: 162 K/uL (ref 150–400)
RBC: 3.27 MIL/uL — ABNORMAL LOW (ref 3.87–5.11)
RDW: 12.6 % (ref 11.5–15.5)
WBC: 11.3 K/uL — ABNORMAL HIGH (ref 4.0–10.5)
nRBC: 0 % (ref 0.0–0.2)

## 2023-12-12 MED ORDER — METHOCARBAMOL 500 MG PO TABS: 500.0000 mg | ORAL_TABLET | Freq: Four times a day (QID) | ORAL | 2 refills | Status: AC | PRN

## 2023-12-12 MED ORDER — SENNA 8.6 MG PO TABS: 2.0000 | ORAL_TABLET | Freq: Every day | ORAL | 0 refills | Status: DC

## 2023-12-12 MED ORDER — ASPIRIN 81 MG PO CHEW: 81.0000 mg | CHEWABLE_TABLET | Freq: Two times a day (BID) | ORAL | 0 refills | Status: AC

## 2023-12-12 MED ORDER — POLYETHYLENE GLYCOL 3350 17 G PO PACK: 17.0000 g | PACK | Freq: Two times a day (BID) | ORAL | 0 refills | Status: AC

## 2023-12-12 MED ORDER — OXYCODONE HCL 5 MG PO TABS: 5.0000 mg | ORAL_TABLET | ORAL | 0 refills | Status: AC | PRN

## 2023-12-12 NOTE — Progress Notes (Signed)
 Physical Therapy Treatment Patient Details Name: Rose Burgess MRN: [id number] DOB: 25-Nov-1945 Today's Date: 12/12/2023   History of Present Illness 78 yo female presents to therapy s/p R TKA on 12/11/2023 due to failure of conservative measures. Pt PMH includes but is not limited to: HLD, hiatal hernia, diverticulosis, arthritis, acid reflux, GERD, chronic pain, B THA, and HTN.    PT Comments   Rose Burgess is a 78 y.o. female POD 1 s/p R TKA. Patient reports IND with mobility at baseline. Patient is now limited by functional impairments (see PT problem list below) and is mod I for bed mobility and S  for transfers. Patient was able to ambulate 150 feet x 2  with RW and S level of assist. Patient instructed in exercise to facilitate ROM and circulation to manage edema reviewed and HO provided. Pt instruction provided on step navigation with use of RW proper technique with step to pattern. Pt and family ed provided on fall risk prevention, use of RW, slowly increasing activity level, use of ice man machine, pain management and goals.  Patient will benefit from continued skilled PT interventions to address impairments and progress towards PLOF. Pt has demonstrated safe mobility as required in home setting, goals address and adequate for d/c, pt to transition home with family support and OPPT services.    If plan is discharge home, recommend the following: A little help with walking and/or transfers;A little help with bathing/dressing/bathroom;Assistance with cooking/housework;Assist for transportation;Help with stairs or ramp for entrance   Can travel by private vehicle        Equipment Recommendations  None recommended by PT    Recommendations for Other Services       Precautions / Restrictions Precautions Precautions: Fall;Knee Restrictions Weight Bearing Restrictions Per Provider Order: No     Mobility  Bed Mobility Overal bed mobility: Modified Independent Bed Mobility: Supine  to Sit                Transfers Overall transfer level: Needs assistance Equipment used: Rolling walker (2 wheels) Transfers: Sit to/from Stand Sit to Stand: Supervision           General transfer comment: min cues from bed, mat table and recliner    Ambulation/Gait Ambulation/Gait assistance: Supervision Gait Distance (Feet): 150 Feet Assistive device: Rolling walker (2 wheels) Gait Pattern/deviations: Step-to pattern, Antalgic, Decreased stance time - right, Trunk flexed Gait velocity: decreased     General Gait Details: slight trunk flexion with B UE support at RW, min cues for safety, posture sequencing and RW management, more normalized pattern with R knee flexion in swing phase and slightly improved posture with use of youth RW which pt states having in home setting   Stairs Stairs: Yes Stairs assistance: Contact guard assist Stair Management: With walker Number of Stairs: 1 (8 inch) General stair comments: min cues for safety, technique and sequencing   Wheelchair Mobility     Tilt Bed    Modified Rankin (Stroke Patients Only)       Balance Overall balance assessment: Needs assistance Sitting-balance support: Feet supported Sitting balance-Leahy Scale: Good     Standing balance support: Bilateral upper extremity supported, During functional activity, Reliant on assistive device for balance Standing balance-Leahy Scale: Fair Standing balance comment: static standing no UE support at [employer] Communication: No  apparent difficulties  Cognition Arousal: Alert Behavior During Therapy: WFL for tasks assessed/performed   PT - Cognitive impairments: No apparent impairments                         Following commands: Intact      Cueing    Exercises Total Joint Exercises Ankle Circles/Pumps: AROM, Both, 10 reps Quad Sets: AROM, Right, 5 reps Short Arc Quad: AROM, Right, 5  reps Heel Slides: AROM, Right, 5 reps Hip ABduction/ADduction: AROM, Right, 5 reps Straight Leg Raises: AROM, Right, 5 reps Knee Flexion: AROM, Right, 5 reps, Seated Goniometric ROM: grossly 0-80 degrees  with pt in long sit in recliner    General Comments        Pertinent Vitals/Pain Pain Assessment Pain Assessment: 0-10 Pain Score: 3  Pain Location: R knee and LE Pain Descriptors / Indicators: Constant, Discomfort, Dull, Tightness, Operative site guarding Pain Intervention(s): Limited activity within patient's tolerance, Monitored during session, Repositioned, Patient requesting pain meds-RN notified, RN gave pain meds during session, Ice applied    Home Living                          Prior Function            PT Goals (current goals can now be found in the care plan section) Acute Rehab PT Goals Patient Stated Goal: to get stronger and go home and do PT PT Goal Formulation: With patient Time For Goal Achievement: 12/25/23 Potential to Achieve Goals: Good Progress towards PT goals: Progressing toward goals    Frequency    7X/week      PT Plan      Co-evaluation              AM-PAC PT 6 Clicks Mobility   Outcome Measure  Help needed turning from your back to your side while in a flat bed without using bedrails?: None Help needed moving from lying on your back to sitting on the side of a flat bed without using bedrails?: A Little Help needed moving to and from a bed to a chair (including a wheelchair)?: A Little Help needed standing up from a chair using your arms (e.g., wheelchair or bedside chair)?: A Little Help needed to walk in hospital room?: A Little Help needed climbing 3-5 steps with a railing? : A Lot 6 Click Score: 18    End of Session Equipment Utilized During Treatment: Gait belt Activity Tolerance: Patient tolerated treatment well;No increased pain Patient left: in chair;with call bell/phone within reach;with family/visitor  present Nurse Communication: Mobility status PT Visit Diagnosis: Unsteadiness on feet (R26.81);Other abnormalities of gait and mobility (R26.89);Muscle weakness (generalized) (M62.81);Difficulty in walking, not elsewhere classified (R26.2);Pain Pain - Right/Left: Right Pain - part of body: Leg;Knee     Time: 9044-8975 PT Time Calculation (min) (ACUTE ONLY): 29 min  Charges:    $Gait Training: 8-22 mins $Therapeutic Exercise: 8-22 mins PT General Charges $$ ACUTE PT VISIT: 1 Visit                     [place], PT Acute Rehab    [place] VEAR Drone 12/12/2023, 10:35 AM

## 2023-12-12 NOTE — Progress Notes (Signed)
   Subjective: 1 Day Post-Op Procedure(s) (LRB): ARTHROPLASTY, KNEE, TOTAL (Right) Patient reports pain as mild.   Patient seen in rounds for Dr. Ernie. Patient is well, and has had no acute complaints or problems. No acute events overnight. Foley catheter removed. Patient ambulated 60 feet with PT.  We will start therapy today.   Objective: Vital signs in last 24 hours: Temp:  [97.3 F (36.3 C)-97.9 F (36.6 C)] 97.6 F (36.4 C) (07/23 0449) Pulse Rate:  [48-61] 54 (07/23 0449) Resp:  [10-20] 17 (07/23 0449) BP: (89-122)/(47-66) 118/56 (07/23 0449) SpO2:  [86 %-100 %] 98 % (07/23 0449)  Intake/Output from previous day:  Intake/Output Summary (Last 24 hours) at 12/12/2023 0903 Last data filed at 12/12/2023 0545 Gross per 24 hour  Intake 2683.58 ml  Output 1550 ml  Net 1133.58 ml     Intake/Output this shift: No intake/output data recorded.  Labs: Recent Labs    12/12/23 0314  HGB 10.4*   Recent Labs    12/12/23 0314  WBC 11.3*  RBC 3.27*  HCT 31.1*  PLT 162   Recent Labs    12/12/23 0314  NA 129*  K 4.6  CL 98  CO2 23  BUN 16  CREATININE 0.73  GLUCOSE 152*  CALCIUM  8.4*   No results for input(s): LABPT, INR in the last 72 hours.  Exam: General - Patient is Alert and Oriented Extremity - Neurologically intact Sensation intact distally Intact pulses distally Dorsiflexion/Plantar flexion intact Dressing - dressing C/D/I Motor Function - intact, moving foot and toes well on exam.   Past Medical History:  Diagnosis Date   Abnormal finding on screening procedure 07/07/2021   Arthritis    Diverticulosis    Essential hypertension 10/17/2015   GERD (gastroesophageal reflux disease)    H/O hiatal hernia    Heart murmur    History of blood transfusion    07/2012 - several blood transfusions per patient    Hyponatremia    h/o hyponatremia on diuretics   Mild aortic stenosis 03/29/2017   Mean gradient 14 mmHg on echo 02/2017.   Panic attack     Pure hypercholesterolemia 07/07/2021    Assessment/Plan: 1 Day Post-Op Procedure(s) (LRB): ARTHROPLASTY, KNEE, TOTAL (Right) Principal Problem:   S/P total knee arthroplasty, right  Estimated body mass index is 28.42 kg/m as calculated from the following:   Height as of this encounter: 5' (1.524 m).   Weight as of this encounter: 66 kg. Advance diet Up with therapy D/C IV fluids   Patient's anticipated LOS is less than 2 midnights, meeting these requirements: - Younger than 15 - Lives within 1 hour of care - Has a competent adult at home to recover with post-op recover - NO history of  - Chronic pain requiring opiods  - Diabetes  - Coronary Artery Disease  - Heart failure  - Heart attack  - Stroke  - DVT/VTE  - Cardiac arrhythmia  - Respiratory Failure/COPD  - Renal failure  - Anemia  - Advanced Liver disease     DVT Prophylaxis - Aspirin  Weight bearing as tolerated.  Hgb stable at 10.4 this AM  Plan is to go Home after hospital stay. Plan for discharge today following 1-2 sessions of PT as long as they are meeting their goals. Patient is scheduled for OPPT. Follow up in the office in 2 weeks.   Rosina Calin, PA-C Orthopedic Surgery [phone] 12/12/2023, 9:03 AM

## 2023-12-12 NOTE — TOC Transition Note (Signed)
 Transition of Care [facility]) - Discharge Note   Patient Details  Name: Rose Burgess MRN: [id number] Date of Birth: 1945-10-30  Transition of Care [facility] CM/SW Contact:  NORMAN ASPEN, LCSW Phone Number: 12/12/2023, 10:35 AM   Clinical Narrative:     Met with pt and family who confirm she has needed DME in the home.  OPPT already arranged  - per son, this is set up with [facility].  No further TOC needs.    Final next level of care: OP Rehab Barriers to Discharge: No Barriers Identified   Patient Goals and CMS Choice Patient states their goals for this hospitalization and ongoing recovery are:: return home          Discharge Placement                       Discharge Plan and Services Additional resources added to the After Visit Summary for                  DME Arranged: N/A DME Agency: NA                  Social Drivers of Health (SDOH) Interventions SDOH Screenings   Food Insecurity: No Food Insecurity (12/11/2023)  Housing: Low Risk  (12/11/2023)  Transportation Needs: No Transportation Needs (12/11/2023)  Utilities: Not At Risk (12/11/2023)  Social Connections: Socially Integrated (12/11/2023)  Tobacco Use: Low Risk  (12/11/2023)     Readmission Risk Interventions     No data to display

## 2023-12-12 NOTE — Plan of Care (Signed)
Care plan complete and adequate for discharge.

## 2023-12-14 ENCOUNTER — Encounter (HOSPITAL_COMMUNITY): Payer: Self-pay | Admitting: Orthopedic Surgery

## 2023-12-17 DIAGNOSIS — M1711 Unilateral primary osteoarthritis, right knee: Secondary | ICD-10-CM | POA: Diagnosis not present

## 2023-12-17 DIAGNOSIS — M25561 Pain in right knee: Secondary | ICD-10-CM | POA: Diagnosis not present

## 2023-12-17 DIAGNOSIS — R269 Unspecified abnormalities of gait and mobility: Secondary | ICD-10-CM | POA: Diagnosis not present

## 2023-12-18 NOTE — Discharge Summary (Signed)
 Patient ID: Rose Burgess MRN: [id number] DOB/AGE: [birth year] 78 y.o.  Admit date: 12/11/2023 Discharge date: 12/12/2023  Admission Diagnoses:  Right knee osteoarthritis  Discharge Diagnoses:  Principal Problem:   S/P total knee arthroplasty, right   Past Medical History:  Diagnosis Date   Abnormal finding on screening procedure 07/07/2021   Arthritis    Diverticulosis    Essential hypertension 10/17/2015   GERD (gastroesophageal reflux disease)    H/O hiatal hernia    Heart murmur    History of blood transfusion    07/2012 - several blood transfusions per patient    Hyponatremia    h/o hyponatremia on diuretics   Mild aortic stenosis 03/29/2017   Mean gradient 14 mmHg on echo 02/2017.   Panic attack    Pure hypercholesterolemia 07/07/2021    Surgeries: Procedure(s): ARTHROPLASTY, KNEE, TOTAL on 12/11/2023   Consultants:   Discharged Condition: Improved  Hospital Course: BRIELLA HOBDAY is an 78 y.o. female who was admitted 12/11/2023 for operative treatment ofS/P total knee arthroplasty, right. Patient has severe unremitting pain that affects sleep, daily activities, and work/hobbies. After pre-op clearance the patient was taken to the operating room on 12/11/2023 and underwent  Procedure(s): ARTHROPLASTY, KNEE, TOTAL.    Patient was given perioperative antibiotics:  Anti-infectives (From admission, onward)    Start     Dose/Rate Route Frequency Ordered Stop   12/11/23 1600  ceFAZolin  (ANCEF ) IVPB 2g/100 mL premix        2 g 200 mL/hr over 30 Minutes Intravenous Every 6 hours 12/11/23 1350 12/11/23 2155   12/11/23 0745  ceFAZolin  (ANCEF ) IVPB 2g/100 mL premix        2 g 200 mL/hr over 30 Minutes Intravenous On call to O.R. 12/11/23 0744 12/11/23 0950        Patient was given sequential compression devices, early ambulation, and chemoprophylaxis to prevent DVT. Patient worked with PT and was meeting their goals regarding safe ambulation and transfers.  Patient  benefited maximally from hospital stay and there were no complications.    Recent vital signs: No data found.   Recent laboratory studies: No results for input(s): WBC, HGB, HCT, PLT, NA, K, CL, CO2, BUN, CREATININE, GLUCOSE, INR, CALCIUM  in the last 72 hours.  Invalid input(s): PT, 2   Discharge Medications:   Allergies as of 12/12/2023   No Known Allergies      Medication List     TAKE these medications    acetaminophen  650 MG CR tablet Commonly known as: TYLENOL  Take 1,300 mg by mouth 2 (two) times daily.   alendronate 70 MG tablet Commonly known as: FOSAMAX 1 tablet 30 minutes before the first food, beverage or medicine of the day with plain water    amLODipine  10 MG tablet Commonly known as: NORVASC  TAKE 1 TABLET BY MOUTH EVERY DAY   aspirin  81 MG chewable tablet Chew 1 tablet (81 mg total) by mouth 2 (two) times daily for 28 days.   b complex vitamins capsule Take 1 capsule by mouth daily.   BIOTIN 5000 PO Take 1 capsule by mouth every morning.   buPROPion  150 MG 24 hr tablet Commonly known as: WELLBUTRIN  XL Take 150 mg by mouth daily.   CAL-MAG-ZINC-D PO Take 1 tablet by mouth 2 (two) times daily.   carvedilol  25 MG tablet Commonly known as: COREG  TAKE 1 TABLET BY MOUTH TWICE A DAY   Coenzyme Q10 100 MG Tabs Take 100 mg by mouth daily.   famotidine  20 MG tablet  Commonly known as: PEPCID  Take 20 mg by mouth daily as needed (Heartburn).   lisinopril  20 MG tablet Commonly known as: ZESTRIL  Take 20 mg by mouth 2 (two) times daily.   melatonin 3 MG Tabs tablet Take 6 mg by mouth at bedtime as needed (Sleep). Gummies   methocarbamol  500 MG tablet Commonly known as: ROBAXIN  Take 1 tablet (500 mg total) by mouth every 6 (six) hours as needed for muscle spasms.   multivitamin with minerals Tabs tablet Take 1 tablet by mouth every morning.   Omega 3 1000 MG Caps Take 2,000 mg by mouth every morning.   oxyCODONE  5 MG  immediate release tablet Commonly known as: Oxy IR/ROXICODONE  Take 1 tablet (5 mg total) by mouth every 4 (four) hours as needed for severe pain (pain score 7-10).   polyethylene glycol 17 g packet Commonly known as: MIRALAX  / GLYCOLAX  Take 17 g by mouth 2 (two) times daily.   Restasis  0.05 % ophthalmic emulsion Generic drug: cycloSPORINE  Place 1 drop into both eyes 2 (two) times daily.   rosuvastatin  10 MG tablet Commonly known as: CRESTOR  TAKE 1 TABLET BY MOUTH EVERY DAY What changed: when to take this   senna 8.6 MG Tabs tablet Commonly known as: SENOKOT Take 2 tablets (17.2 mg total) by mouth at bedtime.   Vitamin D3 50 MCG (2000 UT) capsule Take 2,000 Units by mouth daily.   vitamin E 180 MG (400 UNITS) capsule Take 400 Units by mouth daily.               Discharge Care Instructions  (From admission, onward)           Start     Ordered   12/12/23 0000  Change dressing       Comments: Maintain surgical dressing until follow up in the clinic. If the edges start to pull up, may reinforce with tape. If the dressing is no longer working, may remove and cover with gauze and tape, but must keep the area dry and clean.  Call with any questions or concerns.   12/12/23 0905            Diagnostic Studies: No results found.  Disposition: Discharge disposition: 01-Home or Self Care       Discharge Instructions     Call MD / Call 911   Complete by: As directed    If you experience chest pain or shortness of breath, CALL 911 and be transported to the hospital emergency room.  If you develope a fever above 101 F, pus (white drainage) or increased drainage or redness at the wound, or calf pain, call your surgeon's office.   Change dressing   Complete by: As directed    Maintain surgical dressing until follow up in the clinic. If the edges start to pull up, may reinforce with tape. If the dressing is no longer working, may remove and cover with gauze and tape,  but must keep the area dry and clean.  Call with any questions or concerns.   Constipation Prevention   Complete by: As directed    Drink plenty of fluids.  Prune juice may be helpful.  You may use a stool softener, such as Colace (over the counter) 100 mg twice a day.  Use MiraLax  (over the counter) for constipation as needed.   Diet - low sodium heart healthy   Complete by: As directed    Increase activity slowly as tolerated   Complete by: As directed  Weight bearing as tolerated with assist device (walker, cane, etc) as directed, use it as long as suggested by your surgeon or therapist, typically at least 4-6 weeks.   Post-operative opioid taper instructions:   Complete by: As directed    POST-OPERATIVE OPIOID TAPER INSTRUCTIONS: It is important to wean off of your opioid medication as soon as possible. If you do not need pain medication after your surgery it is ok to stop day one. Opioids include: Codeine, Hydrocodone (Norco, Vicodin), Oxycodone (Percocet, oxycontin ) and hydromorphone  amongst others.  Long term and even short term use of opiods can cause: Increased pain response Dependence Constipation Depression Respiratory depression And more.  Withdrawal symptoms can include Flu like symptoms Nausea, vomiting And more Techniques to manage these symptoms Hydrate well Eat regular healthy meals Stay active Use relaxation techniques(deep breathing, meditating, yoga) Do Not substitute Alcohol to help with tapering If you have been on opioids for less than two weeks and do not have pain than it is ok to stop all together.  Plan to wean off of opioids This plan should start within one week post op of your joint replacement. Maintain the same interval or time between taking each dose and first decrease the dose.  Cut the total daily intake of opioids by one tablet each day Next start to increase the time between doses. The last dose that should be eliminated is the evening dose.       TED hose   Complete by: As directed    Use stockings (TED hose) for 2 weeks on both leg(s).  You may remove them at night for sleeping.        Follow-up Information     Patti Rosina SAUNDERS, PA-C. Go on 12/27/2023.   Specialty: Orthopedic Surgery Why: You are scheduled for a post op appointment on Thursday 12/27/23 at 2:00pm Contact information: [address] [place] [address] [phone]                  Signed: Rosina SAUNDERS Patti 12/18/2023, 1:21 PM

## 2023-12-19 DIAGNOSIS — R269 Unspecified abnormalities of gait and mobility: Secondary | ICD-10-CM | POA: Diagnosis not present

## 2023-12-19 DIAGNOSIS — M25561 Pain in right knee: Secondary | ICD-10-CM | POA: Diagnosis not present

## 2023-12-19 DIAGNOSIS — M1711 Unilateral primary osteoarthritis, right knee: Secondary | ICD-10-CM | POA: Diagnosis not present

## 2023-12-24 DIAGNOSIS — M25561 Pain in right knee: Secondary | ICD-10-CM | POA: Diagnosis not present

## 2023-12-24 DIAGNOSIS — M1711 Unilateral primary osteoarthritis, right knee: Secondary | ICD-10-CM | POA: Diagnosis not present

## 2023-12-24 DIAGNOSIS — R269 Unspecified abnormalities of gait and mobility: Secondary | ICD-10-CM | POA: Diagnosis not present

## 2023-12-26 DIAGNOSIS — M1711 Unilateral primary osteoarthritis, right knee: Secondary | ICD-10-CM | POA: Diagnosis not present

## 2023-12-26 DIAGNOSIS — M25561 Pain in right knee: Secondary | ICD-10-CM | POA: Diagnosis not present

## 2023-12-26 DIAGNOSIS — R269 Unspecified abnormalities of gait and mobility: Secondary | ICD-10-CM | POA: Diagnosis not present

## 2024-01-01 ENCOUNTER — Other Ambulatory Visit (HOSPITAL_BASED_OUTPATIENT_CLINIC_OR_DEPARTMENT_OTHER): Payer: Self-pay | Admitting: Cardiology

## 2024-01-02 DIAGNOSIS — M25561 Pain in right knee: Secondary | ICD-10-CM | POA: Diagnosis not present

## 2024-01-02 DIAGNOSIS — M1711 Unilateral primary osteoarthritis, right knee: Secondary | ICD-10-CM | POA: Diagnosis not present

## 2024-01-02 DIAGNOSIS — R269 Unspecified abnormalities of gait and mobility: Secondary | ICD-10-CM | POA: Diagnosis not present

## 2024-01-07 DIAGNOSIS — R269 Unspecified abnormalities of gait and mobility: Secondary | ICD-10-CM | POA: Diagnosis not present

## 2024-01-07 DIAGNOSIS — M1711 Unilateral primary osteoarthritis, right knee: Secondary | ICD-10-CM | POA: Diagnosis not present

## 2024-01-07 DIAGNOSIS — M25561 Pain in right knee: Secondary | ICD-10-CM | POA: Diagnosis not present

## 2024-01-09 DIAGNOSIS — R269 Unspecified abnormalities of gait and mobility: Secondary | ICD-10-CM | POA: Diagnosis not present

## 2024-01-09 DIAGNOSIS — M25561 Pain in right knee: Secondary | ICD-10-CM | POA: Diagnosis not present

## 2024-01-09 DIAGNOSIS — M1711 Unilateral primary osteoarthritis, right knee: Secondary | ICD-10-CM | POA: Diagnosis not present

## 2024-01-14 DIAGNOSIS — R269 Unspecified abnormalities of gait and mobility: Secondary | ICD-10-CM | POA: Diagnosis not present

## 2024-01-14 DIAGNOSIS — M1711 Unilateral primary osteoarthritis, right knee: Secondary | ICD-10-CM | POA: Diagnosis not present

## 2024-01-14 DIAGNOSIS — M25561 Pain in right knee: Secondary | ICD-10-CM | POA: Diagnosis not present

## 2024-01-16 DIAGNOSIS — R269 Unspecified abnormalities of gait and mobility: Secondary | ICD-10-CM | POA: Diagnosis not present

## 2024-01-16 DIAGNOSIS — M1711 Unilateral primary osteoarthritis, right knee: Secondary | ICD-10-CM | POA: Diagnosis not present

## 2024-01-16 DIAGNOSIS — M25561 Pain in right knee: Secondary | ICD-10-CM | POA: Diagnosis not present

## 2024-01-28 DIAGNOSIS — Z5189 Encounter for other specified aftercare: Secondary | ICD-10-CM | POA: Diagnosis not present

## 2024-02-04 DIAGNOSIS — M8589 Other specified disorders of bone density and structure, multiple sites: Secondary | ICD-10-CM | POA: Diagnosis not present

## 2024-02-05 DIAGNOSIS — R269 Unspecified abnormalities of gait and mobility: Secondary | ICD-10-CM | POA: Diagnosis not present

## 2024-02-05 DIAGNOSIS — M1711 Unilateral primary osteoarthritis, right knee: Secondary | ICD-10-CM | POA: Diagnosis not present

## 2024-02-05 DIAGNOSIS — M25561 Pain in right knee: Secondary | ICD-10-CM | POA: Diagnosis not present

## 2024-02-07 DIAGNOSIS — I700 Atherosclerosis of aorta: Secondary | ICD-10-CM | POA: Diagnosis not present

## 2024-02-07 DIAGNOSIS — M5459 Other low back pain: Secondary | ICD-10-CM | POA: Diagnosis not present

## 2024-02-14 DIAGNOSIS — R269 Unspecified abnormalities of gait and mobility: Secondary | ICD-10-CM | POA: Diagnosis not present

## 2024-02-14 DIAGNOSIS — M25561 Pain in right knee: Secondary | ICD-10-CM | POA: Diagnosis not present

## 2024-02-14 DIAGNOSIS — M1711 Unilateral primary osteoarthritis, right knee: Secondary | ICD-10-CM | POA: Diagnosis not present

## 2024-02-21 DIAGNOSIS — M1711 Unilateral primary osteoarthritis, right knee: Secondary | ICD-10-CM | POA: Diagnosis not present

## 2024-02-21 DIAGNOSIS — R269 Unspecified abnormalities of gait and mobility: Secondary | ICD-10-CM | POA: Diagnosis not present

## 2024-02-21 DIAGNOSIS — M25561 Pain in right knee: Secondary | ICD-10-CM | POA: Diagnosis not present

## 2024-02-22 DIAGNOSIS — H0011 Chalazion right upper eyelid: Secondary | ICD-10-CM | POA: Diagnosis not present

## 2024-03-05 DIAGNOSIS — M5459 Other low back pain: Secondary | ICD-10-CM | POA: Diagnosis not present

## 2024-03-05 DIAGNOSIS — M47816 Spondylosis without myelopathy or radiculopathy, lumbar region: Secondary | ICD-10-CM | POA: Diagnosis not present

## 2024-03-18 DIAGNOSIS — Z01419 Encounter for gynecological examination (general) (routine) without abnormal findings: Secondary | ICD-10-CM | POA: Diagnosis not present

## 2024-03-18 DIAGNOSIS — M810 Age-related osteoporosis without current pathological fracture: Secondary | ICD-10-CM | POA: Diagnosis not present

## 2024-03-18 DIAGNOSIS — Z6827 Body mass index (BMI) 27.0-27.9, adult: Secondary | ICD-10-CM | POA: Diagnosis not present

## 2024-04-08 ENCOUNTER — Encounter: Payer: Self-pay | Admitting: Podiatry

## 2024-04-08 ENCOUNTER — Ambulatory Visit: Admitting: Podiatry

## 2024-04-08 DIAGNOSIS — L600 Ingrowing nail: Secondary | ICD-10-CM | POA: Diagnosis not present

## 2024-04-08 DIAGNOSIS — L03031 Cellulitis of right toe: Secondary | ICD-10-CM | POA: Diagnosis not present

## 2024-04-08 MED ORDER — AMOXICILLIN-POT CLAVULANATE 875-125 MG PO TABS: 1.0000 | ORAL_TABLET | Freq: Two times a day (BID) | ORAL | 0 refills | Status: AC

## 2024-04-08 NOTE — Patient Instructions (Signed)

## 2024-04-08 NOTE — Progress Notes (Signed)
 Patient complains of painful ingrown second toenail left. Patient denies fevers, chills, nausea, vomiting.  Objective:  Vitals: Reviewed  General: Well developed, nourished, in no acute distress, alert and oriented x3   Vascular: DP pulse 2/4 bilateral. PT pulse 2/4 bilateral.  Moderate edema toe with ingrown nail to right.  Capillary refill time immediate bilaterally  Dermatology: Erythema, edema, incurvated nail border both second toe left with purulent drainage at proximal nail fold.. Tenderness present with palpation.  Erythema distal aspect second toe with edema.  Normal skin tone and texture feet with normal hair growth.  Neurological: Grossly intact. Normal reflexes.   Musculoskeletal: Tenderness with palpation of the distal second toe right. No tenderness or painful ROM at IPJ.  Diagnosis: 1.  Cellulitis second toe right 2.  Ingrown nail second toe right    Plan: -New patient office visit for evaluation and management level 3.  Modifier 25. -discussed etiology and treatment of ingrown nails. Discussed surgical vs conservative treatment.  For cellulitis we will place her on antibiotic.  I would consider matrixectomy on the second nail right once the infection is cleared and the area is healed well. -Consent signed for appropriate avulsion affected nail(s). -Rx: Augmentin [place] 5 mg 1 p.o. twice daily for 7 days  Procedure(s):   - Avulsion(s) second nail right: Toe anesthetized with 3cc 1:1 mixture 2% Lidocaine : 0.5% bupivacaine  plain. Surgical site prepped. Digital tourniquet applied.  Avulsion of total nail. performed.  Tourniquet released with good vascularity noticed in digit.  Applied triple antibiotic to nailbed and applied gauze and Coban dressing. - Written and oral postoperative instructions given.  -Return for post-op 2 weeks.  Consider matrixectomy to right  JINNY Prentice Binder, DPM

## 2024-04-22 ENCOUNTER — Ambulatory Visit: Admitting: Podiatry

## 2024-04-22 ENCOUNTER — Encounter: Payer: Self-pay | Admitting: Podiatry

## 2024-04-22 DIAGNOSIS — S91109A Unspecified open wound of unspecified toe(s) without damage to nail, initial encounter: Secondary | ICD-10-CM | POA: Diagnosis not present

## 2024-04-22 NOTE — Progress Notes (Signed)
 Patient presents follow-up avulsion second nail right.  Stilling some tenderness at the site and a little bit of drainage.  No fever chills nausea or vomiting.  She has been still soaking it and putting ointment on it.   Physical exam:  General appearance: Pleasant, and in no acute distress. AOx3.  Vascular: Pedal pulses: DP 2/4 bilaterally, PT 2/4 bilaterally. Mild edema lower legs bilaterally. Capillary fill time bilaterally  Neurological: Grossly intact bilaterally  Dermatologic:   Nail avulsion site second toe partially healed.  Still some granulation tissue present.  Some slight amount of clear drainage.  Swelling is gone down.  Erythema resolved.  Superficial wound measuring 8 x 8 mm.  Musculoskeletal: Hammertoes 2 through 5 right   Diagnosis: 1.  Slow healing wound second toe right  Plan: -Established office visit for evaluation and management level 3. - Discussed with the slow healing wound recommend he continue soaking it or cleaning with soapy water  once or twice a day for the next 2 weeks.  Should be healed by then she can use some Neosporin or bacitracin ointment on it.  Should be healed another 2 weeks if not call for appointment.  Discussed about the nail when it grows back and starts creating problems and is painful would recommend permanent removal of the nail.   Return as needed

## 2024-05-13 ENCOUNTER — Other Ambulatory Visit (HOSPITAL_BASED_OUTPATIENT_CLINIC_OR_DEPARTMENT_OTHER): Payer: Self-pay | Admitting: Cardiology
